# Patient Record
Sex: Male | Born: 1935 | Race: White | Hispanic: No | Marital: Married | Smoking: Never smoker
Health system: Southern US, Community
[De-identification: ages and names within clinical notes are randomized; demographics above are authoritative.]

## PROBLEM LIST (undated history)

## (undated) DIAGNOSIS — B0229 Other postherpetic nervous system involvement: Secondary | ICD-10-CM

## (undated) DIAGNOSIS — C61 Malignant neoplasm of prostate: Secondary | ICD-10-CM

## (undated) DIAGNOSIS — F0390 Unspecified dementia without behavioral disturbance: Secondary | ICD-10-CM

## (undated) DIAGNOSIS — M1990 Unspecified osteoarthritis, unspecified site: Secondary | ICD-10-CM

## (undated) DIAGNOSIS — G629 Polyneuropathy, unspecified: Secondary | ICD-10-CM

## (undated) DIAGNOSIS — I639 Cerebral infarction, unspecified: Secondary | ICD-10-CM

## (undated) HISTORY — PX: TOTAL KNEE ARTHROPLASTY: SHX125

## (undated) HISTORY — DX: Unspecified osteoarthritis, unspecified site: M19.90

## (undated) HISTORY — DX: Cerebral infarction, unspecified: I63.9

## (undated) HISTORY — DX: Malignant neoplasm of prostate: C61

---

## 2000-04-01 ENCOUNTER — Encounter: Payer: Self-pay | Admitting: Ophthalmology

## 2000-04-06 ENCOUNTER — Ambulatory Visit (HOSPITAL_COMMUNITY): Admission: RE | Admit: 2000-04-06 | Discharge: 2000-04-06 | Payer: Self-pay | Admitting: Ophthalmology

## 2000-07-06 ENCOUNTER — Ambulatory Visit (HOSPITAL_COMMUNITY): Admission: RE | Admit: 2000-07-06 | Discharge: 2000-07-06 | Payer: Self-pay | Admitting: Ophthalmology

## 2002-02-20 ENCOUNTER — Ambulatory Visit (HOSPITAL_COMMUNITY): Admission: RE | Admit: 2002-02-20 | Discharge: 2002-02-20 | Payer: Self-pay | Admitting: *Deleted

## 2002-02-20 ENCOUNTER — Encounter (INDEPENDENT_AMBULATORY_CARE_PROVIDER_SITE_OTHER): Payer: Self-pay

## 2003-10-10 ENCOUNTER — Ambulatory Visit (HOSPITAL_BASED_OUTPATIENT_CLINIC_OR_DEPARTMENT_OTHER): Admission: RE | Admit: 2003-10-10 | Discharge: 2003-10-10 | Payer: Self-pay | Admitting: Urology

## 2003-10-10 ENCOUNTER — Ambulatory Visit (HOSPITAL_COMMUNITY): Admission: RE | Admit: 2003-10-10 | Discharge: 2003-10-10 | Payer: Self-pay | Admitting: Urology

## 2004-05-02 ENCOUNTER — Ambulatory Visit: Payer: Self-pay | Admitting: Internal Medicine

## 2004-05-26 ENCOUNTER — Ambulatory Visit: Payer: Self-pay | Admitting: Internal Medicine

## 2004-06-23 ENCOUNTER — Ambulatory Visit: Payer: Self-pay | Admitting: Internal Medicine

## 2004-07-22 ENCOUNTER — Ambulatory Visit: Payer: Self-pay | Admitting: Internal Medicine

## 2004-09-15 ENCOUNTER — Ambulatory Visit: Payer: Self-pay | Admitting: Internal Medicine

## 2004-10-14 ENCOUNTER — Ambulatory Visit: Payer: Self-pay | Admitting: Internal Medicine

## 2004-11-11 ENCOUNTER — Ambulatory Visit: Payer: Self-pay | Admitting: Internal Medicine

## 2004-11-12 ENCOUNTER — Ambulatory Visit: Payer: Self-pay | Admitting: Internal Medicine

## 2004-12-10 ENCOUNTER — Ambulatory Visit: Payer: Self-pay | Admitting: Internal Medicine

## 2004-12-22 ENCOUNTER — Ambulatory Visit: Payer: Self-pay | Admitting: Internal Medicine

## 2005-01-06 ENCOUNTER — Ambulatory Visit: Payer: Self-pay | Admitting: Internal Medicine

## 2005-02-03 ENCOUNTER — Ambulatory Visit: Payer: Self-pay | Admitting: Internal Medicine

## 2005-03-03 ENCOUNTER — Ambulatory Visit: Payer: Self-pay | Admitting: Internal Medicine

## 2005-04-01 ENCOUNTER — Ambulatory Visit: Payer: Self-pay | Admitting: Internal Medicine

## 2005-04-28 ENCOUNTER — Ambulatory Visit: Payer: Self-pay | Admitting: Internal Medicine

## 2005-05-26 ENCOUNTER — Ambulatory Visit: Payer: Self-pay | Admitting: Internal Medicine

## 2005-07-21 ENCOUNTER — Ambulatory Visit: Payer: Self-pay | Admitting: Internal Medicine

## 2005-08-18 ENCOUNTER — Ambulatory Visit: Payer: Self-pay | Admitting: Internal Medicine

## 2005-09-15 ENCOUNTER — Ambulatory Visit: Payer: Self-pay | Admitting: Internal Medicine

## 2005-10-14 ENCOUNTER — Ambulatory Visit: Payer: Self-pay | Admitting: Internal Medicine

## 2005-11-03 ENCOUNTER — Inpatient Hospital Stay (HOSPITAL_COMMUNITY): Admission: RE | Admit: 2005-11-03 | Discharge: 2005-11-07 | Payer: Self-pay | Admitting: Specialist

## 2005-11-09 ENCOUNTER — Ambulatory Visit: Payer: Self-pay | Admitting: Internal Medicine

## 2005-12-08 ENCOUNTER — Ambulatory Visit: Payer: Self-pay | Admitting: Internal Medicine

## 2006-01-05 ENCOUNTER — Ambulatory Visit: Payer: Self-pay | Admitting: Internal Medicine

## 2006-02-02 ENCOUNTER — Ambulatory Visit: Payer: Self-pay | Admitting: Internal Medicine

## 2006-03-02 ENCOUNTER — Ambulatory Visit: Payer: Self-pay | Admitting: Internal Medicine

## 2006-03-31 ENCOUNTER — Ambulatory Visit: Payer: Self-pay | Admitting: Internal Medicine

## 2006-04-28 ENCOUNTER — Ambulatory Visit: Payer: Self-pay | Admitting: Internal Medicine

## 2006-05-07 ENCOUNTER — Ambulatory Visit: Payer: Self-pay | Admitting: Internal Medicine

## 2006-05-25 ENCOUNTER — Ambulatory Visit: Payer: Self-pay | Admitting: Internal Medicine

## 2006-06-22 ENCOUNTER — Ambulatory Visit: Payer: Self-pay | Admitting: Internal Medicine

## 2006-07-21 ENCOUNTER — Ambulatory Visit: Payer: Self-pay | Admitting: Internal Medicine

## 2006-08-17 ENCOUNTER — Ambulatory Visit: Payer: Self-pay | Admitting: Internal Medicine

## 2006-09-14 ENCOUNTER — Ambulatory Visit: Payer: Self-pay | Admitting: Internal Medicine

## 2006-10-12 ENCOUNTER — Ambulatory Visit: Payer: Self-pay | Admitting: Internal Medicine

## 2006-11-09 ENCOUNTER — Ambulatory Visit: Payer: Self-pay | Admitting: Internal Medicine

## 2006-12-07 ENCOUNTER — Ambulatory Visit: Payer: Self-pay | Admitting: Internal Medicine

## 2006-12-24 ENCOUNTER — Ambulatory Visit: Payer: Self-pay | Admitting: Internal Medicine

## 2007-01-04 ENCOUNTER — Ambulatory Visit: Payer: Self-pay | Admitting: Internal Medicine

## 2007-02-01 ENCOUNTER — Ambulatory Visit: Payer: Self-pay | Admitting: Internal Medicine

## 2007-03-01 ENCOUNTER — Ambulatory Visit: Payer: Self-pay | Admitting: Internal Medicine

## 2007-03-29 ENCOUNTER — Ambulatory Visit: Payer: Self-pay | Admitting: Internal Medicine

## 2007-04-26 ENCOUNTER — Ambulatory Visit: Payer: Self-pay | Admitting: Internal Medicine

## 2007-05-24 ENCOUNTER — Ambulatory Visit: Payer: Self-pay | Admitting: Internal Medicine

## 2007-05-26 DIAGNOSIS — J40 Bronchitis, not specified as acute or chronic: Secondary | ICD-10-CM | POA: Insufficient documentation

## 2007-05-26 DIAGNOSIS — C61 Malignant neoplasm of prostate: Secondary | ICD-10-CM

## 2007-05-26 DIAGNOSIS — J302 Other seasonal allergic rhinitis: Secondary | ICD-10-CM

## 2007-05-26 DIAGNOSIS — J3089 Other allergic rhinitis: Secondary | ICD-10-CM

## 2007-06-20 ENCOUNTER — Ambulatory Visit: Payer: Self-pay | Admitting: Internal Medicine

## 2007-07-20 ENCOUNTER — Telehealth: Payer: Self-pay | Admitting: Internal Medicine

## 2007-07-20 ENCOUNTER — Ambulatory Visit: Payer: Self-pay | Admitting: Internal Medicine

## 2007-08-16 ENCOUNTER — Ambulatory Visit: Payer: Self-pay | Admitting: Internal Medicine

## 2007-09-13 ENCOUNTER — Ambulatory Visit: Payer: Self-pay | Admitting: Internal Medicine

## 2007-09-14 ENCOUNTER — Ambulatory Visit: Payer: Self-pay | Admitting: Internal Medicine

## 2007-10-11 ENCOUNTER — Ambulatory Visit: Payer: Self-pay | Admitting: Internal Medicine

## 2007-11-08 ENCOUNTER — Ambulatory Visit: Payer: Self-pay | Admitting: Internal Medicine

## 2007-12-06 ENCOUNTER — Ambulatory Visit: Payer: Self-pay | Admitting: Internal Medicine

## 2008-01-03 ENCOUNTER — Ambulatory Visit: Payer: Self-pay | Admitting: Internal Medicine

## 2008-01-31 ENCOUNTER — Ambulatory Visit: Payer: Self-pay | Admitting: Internal Medicine

## 2008-02-27 ENCOUNTER — Ambulatory Visit: Payer: Self-pay | Admitting: Internal Medicine

## 2008-03-26 ENCOUNTER — Ambulatory Visit: Payer: Self-pay | Admitting: Internal Medicine

## 2008-04-04 ENCOUNTER — Ambulatory Visit: Payer: Self-pay | Admitting: Internal Medicine

## 2008-04-23 ENCOUNTER — Ambulatory Visit: Payer: Self-pay | Admitting: Internal Medicine

## 2008-05-08 ENCOUNTER — Ambulatory Visit: Payer: Self-pay | Admitting: Internal Medicine

## 2008-05-22 ENCOUNTER — Ambulatory Visit: Payer: Self-pay | Admitting: Internal Medicine

## 2008-06-25 ENCOUNTER — Ambulatory Visit: Payer: Self-pay | Admitting: Internal Medicine

## 2008-07-24 ENCOUNTER — Ambulatory Visit: Payer: Self-pay | Admitting: Internal Medicine

## 2008-07-30 ENCOUNTER — Telehealth (INDEPENDENT_AMBULATORY_CARE_PROVIDER_SITE_OTHER): Payer: Self-pay | Admitting: *Deleted

## 2008-08-20 ENCOUNTER — Ambulatory Visit: Payer: Self-pay | Admitting: Internal Medicine

## 2008-09-17 ENCOUNTER — Ambulatory Visit: Payer: Self-pay | Admitting: Internal Medicine

## 2008-10-15 ENCOUNTER — Ambulatory Visit: Payer: Self-pay | Admitting: Internal Medicine

## 2008-11-12 ENCOUNTER — Ambulatory Visit: Payer: Self-pay | Admitting: Internal Medicine

## 2008-12-10 ENCOUNTER — Ambulatory Visit: Payer: Self-pay | Admitting: Internal Medicine

## 2009-01-08 ENCOUNTER — Ambulatory Visit: Payer: Self-pay | Admitting: Internal Medicine

## 2009-01-09 ENCOUNTER — Ambulatory Visit: Payer: Self-pay | Admitting: Internal Medicine

## 2009-02-04 ENCOUNTER — Ambulatory Visit: Payer: Self-pay | Admitting: Internal Medicine

## 2009-03-04 ENCOUNTER — Ambulatory Visit: Payer: Self-pay | Admitting: Internal Medicine

## 2009-03-04 ENCOUNTER — Telehealth: Payer: Self-pay | Admitting: Internal Medicine

## 2009-04-01 ENCOUNTER — Ambulatory Visit: Payer: Self-pay | Admitting: Internal Medicine

## 2009-04-27 DIAGNOSIS — I639 Cerebral infarction, unspecified: Secondary | ICD-10-CM

## 2009-04-27 HISTORY — DX: Cerebral infarction, unspecified: I63.9

## 2009-04-29 ENCOUNTER — Ambulatory Visit: Payer: Self-pay | Admitting: Internal Medicine

## 2009-05-27 ENCOUNTER — Ambulatory Visit: Payer: Self-pay | Admitting: Internal Medicine

## 2009-06-24 ENCOUNTER — Ambulatory Visit: Payer: Self-pay | Admitting: Internal Medicine

## 2009-07-01 ENCOUNTER — Ambulatory Visit: Payer: Self-pay | Admitting: Internal Medicine

## 2009-08-05 ENCOUNTER — Ambulatory Visit: Payer: Self-pay | Admitting: Internal Medicine

## 2009-09-02 ENCOUNTER — Ambulatory Visit: Payer: Self-pay | Admitting: Internal Medicine

## 2009-09-30 ENCOUNTER — Ambulatory Visit: Payer: Self-pay | Admitting: Internal Medicine

## 2009-10-29 ENCOUNTER — Ambulatory Visit: Payer: Self-pay | Admitting: Internal Medicine

## 2009-11-25 ENCOUNTER — Ambulatory Visit: Payer: Self-pay | Admitting: Internal Medicine

## 2009-12-23 ENCOUNTER — Ambulatory Visit: Payer: Self-pay | Admitting: Internal Medicine

## 2010-01-20 ENCOUNTER — Ambulatory Visit: Payer: Self-pay | Admitting: Internal Medicine

## 2010-01-28 ENCOUNTER — Encounter (INDEPENDENT_AMBULATORY_CARE_PROVIDER_SITE_OTHER): Payer: Self-pay | Admitting: Neurology

## 2010-01-28 ENCOUNTER — Encounter: Payer: Self-pay | Admitting: Emergency Medicine

## 2010-01-28 ENCOUNTER — Inpatient Hospital Stay (HOSPITAL_COMMUNITY): Admission: AD | Admit: 2010-01-28 | Discharge: 2010-01-31 | Payer: Self-pay | Admitting: Neurology

## 2010-01-29 ENCOUNTER — Ambulatory Visit: Payer: Self-pay | Admitting: Physical Medicine & Rehabilitation

## 2010-01-31 ENCOUNTER — Inpatient Hospital Stay (HOSPITAL_COMMUNITY)
Admission: RE | Admit: 2010-01-31 | Discharge: 2010-02-22 | Payer: Self-pay | Admitting: Physical Medicine & Rehabilitation

## 2010-02-01 ENCOUNTER — Ambulatory Visit: Payer: Self-pay | Admitting: Physical Medicine & Rehabilitation

## 2010-02-04 ENCOUNTER — Ambulatory Visit: Payer: Self-pay | Admitting: Psychology

## 2010-02-24 ENCOUNTER — Telehealth (INDEPENDENT_AMBULATORY_CARE_PROVIDER_SITE_OTHER): Payer: Self-pay | Admitting: *Deleted

## 2010-02-26 ENCOUNTER — Ambulatory Visit: Payer: Self-pay | Admitting: Internal Medicine

## 2010-02-27 ENCOUNTER — Observation Stay (HOSPITAL_COMMUNITY)
Admission: EM | Admit: 2010-02-27 | Discharge: 2010-03-04 | Payer: Self-pay | Source: Home / Self Care | Admitting: Emergency Medicine

## 2010-02-28 ENCOUNTER — Ambulatory Visit: Payer: Self-pay | Admitting: Physical Medicine & Rehabilitation

## 2010-03-19 ENCOUNTER — Ambulatory Visit: Payer: Self-pay | Admitting: Internal Medicine

## 2010-04-11 ENCOUNTER — Encounter
Admission: RE | Admit: 2010-04-11 | Discharge: 2010-04-17 | Payer: Self-pay | Source: Home / Self Care | Attending: Physical Medicine & Rehabilitation | Admitting: Physical Medicine & Rehabilitation

## 2010-04-17 ENCOUNTER — Ambulatory Visit: Payer: Self-pay | Admitting: Physical Medicine & Rehabilitation

## 2010-04-25 ENCOUNTER — Ambulatory Visit: Payer: Self-pay | Admitting: Internal Medicine

## 2010-05-05 ENCOUNTER — Encounter
Admission: RE | Admit: 2010-05-05 | Discharge: 2010-05-27 | Payer: Self-pay | Source: Home / Self Care | Attending: Physical Medicine & Rehabilitation | Admitting: Physical Medicine & Rehabilitation

## 2010-05-14 ENCOUNTER — Encounter
Admission: RE | Admit: 2010-05-14 | Discharge: 2010-05-20 | Payer: Self-pay | Source: Home / Self Care | Attending: Physical Medicine & Rehabilitation | Admitting: Physical Medicine & Rehabilitation

## 2010-05-14 ENCOUNTER — Ambulatory Visit: Payer: Self-pay | Admitting: Internal Medicine

## 2010-05-16 ENCOUNTER — Ambulatory Visit: Payer: Self-pay | Admitting: Internal Medicine

## 2010-05-20 ENCOUNTER — Ambulatory Visit
Admission: RE | Admit: 2010-05-20 | Discharge: 2010-05-20 | Payer: Self-pay | Source: Home / Self Care | Attending: Physical Medicine & Rehabilitation | Admitting: Physical Medicine & Rehabilitation

## 2010-05-28 ENCOUNTER — Ambulatory Visit: Payer: Medicare Other | Attending: Physical Medicine & Rehabilitation | Admitting: Occupational Therapy

## 2010-05-28 DIAGNOSIS — M6281 Muscle weakness (generalized): Secondary | ICD-10-CM | POA: Insufficient documentation

## 2010-05-28 DIAGNOSIS — M25619 Stiffness of unspecified shoulder, not elsewhere classified: Secondary | ICD-10-CM | POA: Insufficient documentation

## 2010-05-28 DIAGNOSIS — I69998 Other sequelae following unspecified cerebrovascular disease: Secondary | ICD-10-CM | POA: Insufficient documentation

## 2010-05-28 DIAGNOSIS — R269 Unspecified abnormalities of gait and mobility: Secondary | ICD-10-CM | POA: Insufficient documentation

## 2010-05-28 DIAGNOSIS — R279 Unspecified lack of coordination: Secondary | ICD-10-CM | POA: Insufficient documentation

## 2010-05-28 DIAGNOSIS — Z5189 Encounter for other specified aftercare: Secondary | ICD-10-CM | POA: Insufficient documentation

## 2010-05-29 ENCOUNTER — Encounter: Payer: Medicare Other | Admitting: Occupational Therapy

## 2010-05-29 ENCOUNTER — Ambulatory Visit: Payer: Medicare Other | Admitting: Physical Therapy

## 2010-05-29 NOTE — Progress Notes (Signed)
Summary: allergy med Dr. Maple Hudson  Phone Note Call from Patient Call back at Home Phone [phone]   Caller: vm Summary of Call: Left vm [facility] for Dr. Terisa Starr.  Wants call back to help him get allergy med to give to self or at home Called & gave Dr. Roxy Cedar number.  Rudy Jew, RN  February 24, 2010 12:20 PM    Initial call taken by: Rudy Jew, RN,  February 24, 2010 11:11 AM

## 2010-05-29 NOTE — Miscellaneous (Signed)
Summary: Injection Record / [facility]    Injection Record / [facility]    Imported By: Lennie Odor 12/27/2009 10:22:13  _____________________________________________________________________  External Attachment:    Type:   Image     Comment:   External Document

## 2010-05-29 NOTE — Progress Notes (Signed)
Summary: Blake Harrison   Phone Note Other Incoming   Caller: Blake Harrison Summary of Call: Blake Harrison called and said he had a stroke about 4 weeks ago. He said he is at home now and has a Engineer, civil (consulting) and a therapist coming to his home. He says he is allergic to dogs and has to big greyhounds that sleep with [place] at night. He woke up Saturday and could not breathe and called the emergency room and was told to take some benadryl and use nasal spray and that helped and he was told to take 3 more benadryl for Sunday and use some nasal spray. He said he did that and is fine and wants to come pick up his allergy medicine. and get his shots from his nurse that comes to his house. I advised him how sorry I was that he was sick and that I would ask you about it and get back to him. Please advise. His last shot was 01-20-10 Initial call taken by: Clarise Cruz Duncan Dull),  February 24, 2010 4:23 PM  Follow-up for Phone Call        OK, but you  need to speak directly to his nurse to make sure she knows what to do and how to contact the allergy lab for any questions at all.   I have put Epipen on med list. Need to send him the script, and the nurse needs to know how and when to use it.  Follow-up by: Waymon Budge MD,  February 25, 2010 8:40 AM  Additional Follow-up for Phone Call Additional follow up Details #1::        Blake Harrison called back and said he contacted nurse and was advised that she can not give shots to patient. That she is not allowed to give shots. [name] ask if he came in for shots could we come down stairs and give him a shot with him sitting in the car. I advised him yes that we could do that.    New/Updated Medications: * ALLERGY VACCINE 1:10 G BY [facility] NURSE  EPIPEN 0.3 MG/0.3ML DEVI (EPINEPHRINE) For severe allergic reaction Prescriptions: EPIPEN 0.3 MG/0.3ML DEVI (EPINEPHRINE) For severe allergic reaction  #1 x prn   Entered and Authorized by:   Waymon Budge MD   Signed by:    Waymon Budge MD on 02/25/2010   Method used:   Historical   RxID:   [id number]

## 2010-05-29 NOTE — Assessment & Plan Note (Signed)
Summary: rov//mbw   Primary Provider/Referring Provider:  Thayer Headings  CC:  Follow up visit-allergies; no complaints..  History of Present Illness: 1. Allergic rhinitis. 2. Tracheobronchitis. 3. Prostate cancer - seed implants [name] [name], M.D.).   HISTORY:  He is going to the gym 6 days per week.  Feels allergy vaccine works well for him.  No problems.  He had left total knee replacement in 2007 and had chest x-ray then so he did not feel he needed to come here. He continues allergy vaccine at 1:10.   05/08/08- Allergic rhinitis, tracheobronchitis Got both flu vax. His wife got sick after H1N1, few days later he had sore throat- zinc lozenges. Now feels well. Denies routine cough, head congestion, discharge or pain. He is here for annual f/u of allergy vaccine. He feels this definitely helps. We reviewed risk/benefit and option to stop. He is getting injections once monthly without any problems.  2009/07/15- Allergic rhinitis, tracheobronchitis He continues to do well. He continues allergy vaccine at 1:10. 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 Had flu vax and has not had flu. Denies hives or cough attributable to his ACE inhibitor.        Current Medications (verified): 1)  Allergy Vaccine 1:10 Gh 2)  Aspirin 81 Mg Tbec (Aspirin) .... Take 1 By Mouth Once Daily 3)  Lisinopril-Hydrochlorothiazide 20-12.5 Mg Tabs (Lisinopril-Hydrochlorothiazide) .... Take 2 By Mouth Once Daily 4)  Multivitamins   Tabs (Multiple Vitamin) 5)  Naproxen .... Take As Needed 6)  Diazepam 5 Mg Tabs (Diazepam) .... Take 1/2 To 1 Two Times A Day As Needed 7)  Vitamin D3 2000 Unit Caps (Cholecalciferol) .... Take 1 By Mouth Once Daily 8)  Norvasc 2.5 Mg Tabs (Amlodipine Besylate) .... 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 Rides bike at gym  Review of Systems      See HPI  The patient denies anorexia, fever, weight loss, weight gain, vision loss, decreased hearing, hoarseness, chest pain, syncope, dyspnea on exertion, peripheral edema, prolonged cough, headaches, hemoptysis, abdominal pain, and severe indigestion/heartburn.    Vital Signs:  Patient profile:   77 year old male Weight:      141.13 pounds O2 Sat:      98 % on Room air Pulse rate:   66 / minute BP sitting:   134 / 78  (left arm) Cuff size:   regular  Vitals Entered By: Reynaldo Minium CMA 2009-07-15 9:26 AM)  O2 Flow:  Room air  Physical Exam  Additional Exam:  General: A/Ox3; pleasant and cooperative, NAD, trim  and alert SKIN: no rash, lesions NODES: no lymphadenopathy HEENT: [place]/AT, EOM- WNL, Conjuctivae- clear, PERRLA, TM-WNL, Nose- clear, Throat- clear and wnl, Mallampati  II NECK: Supple w/ fair ROM, JVD- none, normal carotid impulses w/o bruits Thyroid- normal to palpation CHEST: Clear to P&A HEART: RRR, no m/g/r heard ABDOMEN: Soft and nl;  ZOX:WRUE, nl pulses, no edema  NEURO: Grossly intact to observation      Impression & Recommendations:  Problem # 1:  ALLERGIC RHINITIS (ICD-477.9)  Doing very well. He  has been on allergy vaccine a long time. We discussed continuation  He gets his shots monthly.    Medications Added to Medication List This Visit: 1)  Aspirin 81 Mg Tbec (Aspirin) .... Take 1 by mouth once daily 2)  Lisinopril-hydrochlorothiazide 20-12.5 Mg Tabs (Lisinopril-hydrochlorothiazide) .... Take 2 by mouth once daily 3)  Diazepam 5 Mg Tabs (Diazepam) .... Take 1/2 to 1 two times a day as needed 4)  Vitamin D3 2000 Unit Caps (Cholecalciferol) .... Take 1 by mouth once daily 5)  Norvasc 2.5 Mg Tabs (Amlodipine besylate) .... Take 1 by mouth once daily  Other Orders: Est. Patient Level III (45409)  Patient Instructions: 1)  Schedule return in one year, earlier if needed 2)  OK to continue allergy vaccine

## 2010-06-02 ENCOUNTER — Ambulatory Visit: Payer: Medicare Other | Admitting: Occupational Therapy

## 2010-06-02 ENCOUNTER — Ambulatory Visit: Payer: Medicare Other | Admitting: Physical Therapy

## 2010-06-03 ENCOUNTER — Ambulatory Visit: Payer: Medicare Other | Admitting: Physical Therapy

## 2010-06-03 ENCOUNTER — Ambulatory Visit: Payer: Medicare Other | Admitting: Occupational Therapy

## 2010-06-05 ENCOUNTER — Ambulatory Visit: Payer: Medicare Other | Admitting: Occupational Therapy

## 2010-06-05 ENCOUNTER — Ambulatory Visit: Payer: Medicare Other | Admitting: Physical Therapy

## 2010-06-09 ENCOUNTER — Ambulatory Visit: Payer: Medicare Other | Admitting: Physical Therapy

## 2010-06-09 ENCOUNTER — Ambulatory Visit: Payer: Medicare Other | Admitting: Occupational Therapy

## 2010-06-10 ENCOUNTER — Ambulatory Visit: Payer: Self-pay | Admitting: Physical Therapy

## 2010-06-10 ENCOUNTER — Encounter: Payer: Self-pay | Admitting: Occupational Therapy

## 2010-06-12 ENCOUNTER — Ambulatory Visit: Payer: Medicare Other | Admitting: Occupational Therapy

## 2010-06-12 ENCOUNTER — Ambulatory Visit (INDEPENDENT_AMBULATORY_CARE_PROVIDER_SITE_OTHER): Payer: Medicare Other

## 2010-06-12 ENCOUNTER — Ambulatory Visit: Payer: Medicare Other | Admitting: Physical Therapy

## 2010-06-12 DIAGNOSIS — J301 Allergic rhinitis due to pollen: Secondary | ICD-10-CM

## 2010-06-16 ENCOUNTER — Encounter: Payer: Medicare Other | Attending: Physical Medicine & Rehabilitation

## 2010-06-16 ENCOUNTER — Ambulatory Visit: Payer: Medicare Other | Admitting: Physical Medicine & Rehabilitation

## 2010-06-16 ENCOUNTER — Ambulatory Visit: Payer: Medicare Other | Admitting: Occupational Therapy

## 2010-06-16 ENCOUNTER — Ambulatory Visit: Payer: Medicare Other | Admitting: Physical Therapy

## 2010-06-16 DIAGNOSIS — M6250 Muscle wasting and atrophy, not elsewhere classified, unspecified site: Secondary | ICD-10-CM | POA: Insufficient documentation

## 2010-06-16 DIAGNOSIS — G609 Hereditary and idiopathic neuropathy, unspecified: Secondary | ICD-10-CM

## 2010-06-16 DIAGNOSIS — I69959 Hemiplegia and hemiparesis following unspecified cerebrovascular disease affecting unspecified side: Secondary | ICD-10-CM | POA: Insufficient documentation

## 2010-06-16 DIAGNOSIS — M216X9 Other acquired deformities of unspecified foot: Secondary | ICD-10-CM | POA: Insufficient documentation

## 2010-06-19 ENCOUNTER — Ambulatory Visit: Payer: Medicare Other | Admitting: Physical Therapy

## 2010-06-19 ENCOUNTER — Ambulatory Visit: Payer: Medicare Other | Admitting: Occupational Therapy

## 2010-06-23 ENCOUNTER — Ambulatory Visit: Payer: Medicare Other | Admitting: Occupational Therapy

## 2010-06-23 ENCOUNTER — Ambulatory Visit: Payer: Medicare Other | Admitting: Physical Therapy

## 2010-06-26 ENCOUNTER — Ambulatory Visit: Payer: Medicare Other | Admitting: Physical Therapy

## 2010-06-26 ENCOUNTER — Ambulatory Visit: Payer: Medicare Other | Attending: Physical Medicine & Rehabilitation | Admitting: Occupational Therapy

## 2010-06-26 DIAGNOSIS — R269 Unspecified abnormalities of gait and mobility: Secondary | ICD-10-CM | POA: Insufficient documentation

## 2010-06-26 DIAGNOSIS — Z5189 Encounter for other specified aftercare: Secondary | ICD-10-CM | POA: Insufficient documentation

## 2010-06-26 DIAGNOSIS — R279 Unspecified lack of coordination: Secondary | ICD-10-CM | POA: Insufficient documentation

## 2010-06-26 DIAGNOSIS — M25619 Stiffness of unspecified shoulder, not elsewhere classified: Secondary | ICD-10-CM | POA: Insufficient documentation

## 2010-06-26 DIAGNOSIS — M6281 Muscle weakness (generalized): Secondary | ICD-10-CM | POA: Insufficient documentation

## 2010-06-26 DIAGNOSIS — I69998 Other sequelae following unspecified cerebrovascular disease: Secondary | ICD-10-CM | POA: Insufficient documentation

## 2010-06-30 ENCOUNTER — Ambulatory Visit (INDEPENDENT_AMBULATORY_CARE_PROVIDER_SITE_OTHER): Payer: Medicare Other | Admitting: Internal Medicine

## 2010-06-30 ENCOUNTER — Encounter: Payer: Self-pay | Admitting: Internal Medicine

## 2010-06-30 ENCOUNTER — Ambulatory Visit: Payer: Medicare Other | Admitting: Physical Therapy

## 2010-06-30 ENCOUNTER — Ambulatory Visit: Payer: Medicare Other | Admitting: Occupational Therapy

## 2010-06-30 DIAGNOSIS — J309 Allergic rhinitis, unspecified: Secondary | ICD-10-CM

## 2010-07-02 ENCOUNTER — Telehealth (INDEPENDENT_AMBULATORY_CARE_PROVIDER_SITE_OTHER): Payer: Self-pay | Admitting: *Deleted

## 2010-07-03 ENCOUNTER — Ambulatory Visit: Payer: Medicare Other | Admitting: Occupational Therapy

## 2010-07-03 ENCOUNTER — Ambulatory Visit: Payer: Medicare Other | Admitting: Physical Therapy

## 2010-07-07 ENCOUNTER — Encounter: Payer: Medicare Other | Admitting: Occupational Therapy

## 2010-07-07 ENCOUNTER — Ambulatory Visit: Payer: Medicare Other | Admitting: Physical Therapy

## 2010-07-08 ENCOUNTER — Ambulatory Visit: Payer: Medicare Other | Admitting: Physical Therapy

## 2010-07-08 LAB — URINALYSIS, ROUTINE W REFLEX MICROSCOPIC
Bilirubin Urine: NEGATIVE
Nitrite: NEGATIVE
Specific Gravity, Urine: 1.018 (ref 1.005–1.030)
pH: 5.5 (ref 5.0–8.0)

## 2010-07-08 LAB — BASIC METABOLIC PANEL
CO2: 28 mEq/L (ref 19–32)
Chloride: 109 mEq/L (ref 96–112)
GFR calc Af Amer: 49 mL/min — ABNORMAL LOW (ref >60)
GFR calc non Af Amer: 33 mL/min — ABNORMAL LOW (ref >60)
Potassium: 3.5 mEq/L (ref 3.5–5.1)
Potassium: 4.3 mEq/L (ref 3.5–5.1)
Sodium: 139 mEq/L (ref 135–145)
Sodium: 140 mEq/L (ref 135–145)

## 2010-07-08 LAB — URINE CULTURE: Culture: NO GROWTH

## 2010-07-08 LAB — CBC
HCT: 30.7 % — ABNORMAL LOW (ref 39.0–52.0)
HCT: 32.1 % — ABNORMAL LOW (ref 39.0–52.0)
Hemoglobin: 10.4 g/dL — ABNORMAL LOW (ref 13.0–17.0)
Hemoglobin: 11.1 g/dL — ABNORMAL LOW (ref 13.0–17.0)
Hemoglobin: 11.4 g/dL — ABNORMAL LOW (ref 13.0–17.0)
MCH: 32.1 pg (ref 26.0–34.0)
MCH: 32.2 pg (ref 26.0–34.0)
MCV: 94.7 fL (ref 78.0–100.0)
RBC: 3.24 MIL/uL — ABNORMAL LOW (ref 4.22–5.81)
RBC: 3.42 MIL/uL — ABNORMAL LOW (ref 4.22–5.81)
RBC: 3.53 MIL/uL — ABNORMAL LOW (ref 4.22–5.81)
WBC: 6.4 10*3/uL (ref 4.0–10.5)

## 2010-07-08 LAB — DIFFERENTIAL
Basophils Absolute: 0 10*3/uL (ref 0.0–0.1)
Lymphocytes Relative: 5 % — ABNORMAL LOW (ref 12–46)
Lymphs Abs: 0.3 10*3/uL — ABNORMAL LOW (ref 0.7–4.0)
Monocytes Absolute: 0.4 10*3/uL (ref 0.1–1.0)
Monocytes Relative: 6 % (ref 3–12)
Neutro Abs: 5.6 10*3/uL (ref 1.7–7.7)

## 2010-07-08 LAB — URINE MICROSCOPIC-ADD ON

## 2010-07-08 NOTE — Progress Notes (Signed)
Summary: med list, epipen rx  Phone Note Call from Patient Call back at Home Phone [phone]   Caller: Patient Call For: young Reason for Call: Talk to Nurse Summary of Call: Patient calling with updated med list.  Patient is no longer taking diazepam and Vit D should be 1000 units a day instead of 2000. Initial call taken by: Lehman Prom,  July 02, 2010 3:54 PM  Follow-up for Phone Call        called, spoke with pt.  States CDY wanted him to call back to correct his med list.  States he is no longer taking diazepam, is taking vit d 1000mg  once daily, and states he thinks he has epipen "somewhere in the house" but not sure as he has never used it.  Advised he is supposed to have epipen on hand d/t taking allergy shots.  He verbalized understanding.  Will forward to CDY as pt states he was requesting this info. Follow-up by: Gweneth Dimitri RN,  July 02, 2010 4:15 PM  Additional Follow-up for Phone Call Additional follow up Details #1::        Please send Rx for Epipen for patient. Noted.Reynaldo Minium CMA  July 03, 2010 4:41 PM   ATC pt's home # - ? pharm.  NA and unable to leave message. Gweneth Dimitri RN  July 03, 2010 4:46 PM  [facility] Gweneth Dimitri RN  July 04, 2010 8:41 AM      Additional Follow-up for Phone Call Additional follow up Details #2::    Pt returned call.  He was informed he needs to pick up epipen rx at pharm as this is part of allergy vaccine protocol.  He verbalized understanding.  Also, states he has now found vitamin d3 2000 and is taking this in place of the 1000.  Advised I will change this on med list.  He verbalized understanding of instructions.  New/Updated Medications: VITAMIN D 1000 UNIT TABS (CHOLECALCIFEROL) Take 1 tablet by mouth once a day VITAMIN D3 2000 UNIT TABS (CHOLECALCIFEROL) Take 1 tablet by mouth once a day Prescriptions: EPIPEN 0.3 MG/0.3ML DEVI (EPINEPHRINE) For severe allergic reaction  #1 x prn   Entered by:   Gweneth Dimitri  RN   Authorized by:   Waymon Budge MD   Signed by:   Gweneth Dimitri RN on 07/04/2010   Method used:   Electronically to        [facility]* (retail)       [address]       [place], [place]  [id number]       Ph: [phone]       Fax: [phone]   RxID:   [id number]

## 2010-07-08 NOTE — Assessment & Plan Note (Signed)
Summary: yearly follow up   Primary Provider/Referring Provider:  Thayer Headings  CC:  Blake Harrison follow up visit-allergies; still taking vaccine and no complaints..  History of Present Illness:  05/08/08- Allergic rhinitis, tracheobronchitis Got both flu vax. His wife got sick after H1N1, few days later he had sore throat- zinc lozenges. Now feels well. Denies routine cough, head congestion, discharge or pain. He is here for annual f/u of allergy vaccine. He feels this definitely helps. We reviewed risk/benefit and option to stop. He is getting injections once monthly without any problems.  07-23-2009- Allergic rhinitis, tracheobronchitis He continues to do well. He continues allergy vaccine at 1:10. 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 Had flu vax and has not had flu. Denies hives or cough attributable to his ACE inhibitor.  June 30, 2010-  Allergic rhinitis, tracheobronchitis Nurse-CC:  Yearly follow up visit-allergies; still taking vaccine and no complaints. Since last here he had a stroke- affected left body, but back to driving. He is coming here for his shots again. He has not been noting  rhinitis symptoms so far this Spring     Preventive Screening-Counseling & Management  Alcohol-Tobacco     Smoking Status: never  Current Medications (verified): 1)  Allergy Vaccine 1:10 G By [facility] Nurse 2)  Aspirin 325 Mg Tabs (Aspirin) .... Take 1 By Mouth Once Daily 3)  Lisinopril-Hydrochlorothiazide 20-12.5 Mg Tabs (Lisinopril-Hydrochlorothiazide) .... Take 2 By Mouth Once Daily 4)  Multivitamins   Tabs (Multiple Vitamin) 5)  Naproxen .... Take As Needed 6)  Diazepam 5 Mg Tabs (Diazepam) .... Take 1/2 To 1 Two Times A Day As Needed 7)  Vitamin D3 2000 Unit Caps (Cholecalciferol) .... Take 1 By Mouth Once Daily 8)  Norvasc 2.5 Mg Tabs (Amlodipine Besylate) .... Take 1 By Mouth Once  Daily 9)  Epipen 0.3 Mg/0.18ml Devi (Epinephrine) .... 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 Rides bike at gym  Risk Factors: Smoking Status: never (06/30/2010)  Past Medical History: Allergic Rhinitis Arthritis knees Prostate cancer- seeds CVA 2011  Review of Systems      See HPI       The patient complains of nasal congestion/difficulty breathing through nose.  The patient denies shortness of breath with activity, shortness of breath at rest, productive cough, non-productive cough, coughing up blood, chest pain, irregular heartbeats, acid heartburn, indigestion, loss of appetite, weight change, abdominal pain, difficulty swallowing, sore throat, tooth/dental problems, headaches, sneezing, itching, ear ache, rash, change in color of mucus, and fever.    Vital Signs:  Patient profile:   77 year old male Weight:      136.50 pounds O2 Sat:      94 % on Room air Pulse rate:   62 / minute BP sitting:   122 / 70  (left arm) Cuff size:   regular  Vitals Entered By: Reynaldo Minium CMA (June 30, 2010 9:28 AM)  O2 Flow:  Room air CC: Yearly follow up visit-allergies; still taking vaccine and  no complaints.   Physical Exam  Additional Exam:  General: A/Ox3; pleasant and cooperative, NAD, trim and alert SKIN: no rash, lesions NODES: no lymphadenopathy HEENT: [place]/AT, EOM- WNL, Conjuctivae- clear, PERRLA, TM-WNL, Nose- clear, Throat- clear and wnl, Mallampati  II NECK: Supple w/ fair ROM, JVD- none, normal carotid impulses w/o bruits Thyroid- normal to palpation CHEST: Clear to P&A HEART: RRR, no  m/g/r heard ABDOMEN: Soft and nl;  ZOX:WRUE, nl pulses, no edema  NEURO: Weak left body, cane, alert and articulate.      Impression & Recommendations:  Problem # 1:  ALLERGIC RHINITIS (ICD-477.9)  Good control as he continues allergy vaccine. He is satsfied that he is doing well. We have discussed ok to stop, but he votes to continue.   Medications Added to Medication List This Visit: 1)  Aspirin 325 Mg Tabs (Aspirin) .... Take 1 by mouth once daily  Other Orders: Est. Patient Level III (45409)  Patient Instructions: 1)  Please schedule a follow-up appointment in 1 year. 2)  Continue allergy vaccine as discussed.

## 2010-07-09 LAB — URINALYSIS, MICROSCOPIC ONLY
Bilirubin Urine: NEGATIVE
Nitrite: NEGATIVE
Protein, ur: 30 mg/dL — AB
Specific Gravity, Urine: 1.023 (ref 1.005–1.030)
Urobilinogen, UA: 0.2 mg/dL (ref 0.0–1.0)

## 2010-07-09 LAB — CBC
HCT: 33.2 % — ABNORMAL LOW (ref 39.0–52.0)
HCT: 33.3 % — ABNORMAL LOW (ref 39.0–52.0)
HCT: 37.5 % — ABNORMAL LOW (ref 39.0–52.0)
HCT: 39.8 % (ref 39.0–52.0)
HCT: 40.3 % (ref 39.0–52.0)
HCT: 40.5 % (ref 39.0–52.0)
Hemoglobin: 11.1 g/dL — ABNORMAL LOW (ref 13.0–17.0)
Hemoglobin: 11.3 g/dL — ABNORMAL LOW (ref 13.0–17.0)
Hemoglobin: 12.8 g/dL — ABNORMAL LOW (ref 13.0–17.0)
Hemoglobin: 12.9 g/dL — ABNORMAL LOW (ref 13.0–17.0)
Hemoglobin: 13.9 g/dL (ref 13.0–17.0)
Hemoglobin: 13.9 g/dL (ref 13.0–17.0)
MCH: 31.2 pg (ref 26.0–34.0)
MCH: 31.6 pg (ref 26.0–34.0)
MCH: 31.2 pg (ref 26.0–34.0)
MCH: 31.4 pg (ref 26.0–34.0)
MCH: 31.7 pg (ref 26.0–34.0)
MCH: 31.8 pg (ref 26.0–34.0)
MCHC: 32.9 g/dL (ref 30.0–36.0)
MCHC: 33.9 g/dL (ref 30.0–36.0)
MCHC: 33.8 g/dL (ref 30.0–36.0)
MCHC: 34.1 g/dL (ref 30.0–36.0)
MCV: 91.8 fL (ref 78.0–100.0)
MCV: 92.1 fL (ref 78.0–100.0)
MCV: 93 fL (ref 78.0–100.0)
MCV: 93.3 fL (ref 78.0–100.0)
MCV: 94.1 fL (ref 78.0–100.0)
MCV: 94.4 fL (ref 78.0–100.0)
MCV: 95.4 fL (ref 78.0–100.0)
MCV: 95.8 fL (ref 78.0–100.0)
Platelets: 207 10*3/uL (ref 150–400)
Platelets: 213 10*3/uL (ref 150–400)
Platelets: 233 10*3/uL (ref 150–400)
RBC: 3.56 MIL/uL — ABNORMAL LOW (ref 4.22–5.81)
RBC: 4.14 MIL/uL — ABNORMAL LOW (ref 4.22–5.81)
RBC: 4.39 MIL/uL (ref 4.22–5.81)
RDW: 11.8 % (ref 11.5–15.5)
RDW: 11.9 % (ref 11.5–15.5)
RDW: 12.2 % (ref 11.5–15.5)
RDW: 12.3 % (ref 11.5–15.5)
WBC: 5.5 10*3/uL (ref 4.0–10.5)
WBC: 5.6 10*3/uL (ref 4.0–10.5)
WBC: 5.9 10*3/uL (ref 4.0–10.5)
WBC: 8.3 10*3/uL (ref 4.0–10.5)

## 2010-07-09 LAB — COMPREHENSIVE METABOLIC PANEL
ALT: 21 U/L (ref 0–53)
AST: 24 U/L (ref 0–37)
AST: 25 U/L (ref 0–37)
Albumin: 3.4 g/dL — ABNORMAL LOW (ref 3.5–5.2)
Albumin: 3.6 g/dL (ref 3.5–5.2)
Alkaline Phosphatase: 49 U/L (ref 39–117)
Alkaline Phosphatase: 54 U/L (ref 39–117)
BUN: 14 mg/dL (ref 6–23)
BUN: 19 mg/dL (ref 6–23)
BUN: 20 mg/dL (ref 6–23)
CO2: 30 mEq/L (ref 19–32)
CO2: 30 mEq/L (ref 19–32)
Calcium: 9.2 mg/dL (ref 8.4–10.5)
Chloride: 102 mEq/L (ref 96–112)
Chloride: 102 mEq/L (ref 96–112)
Creatinine, Ser: 1.1 mg/dL (ref 0.4–1.5)
Creatinine, Ser: 1.24 mg/dL (ref 0.4–1.5)
GFR calc Af Amer: >60 mL/min (ref >60)
GFR calc non Af Amer: 60 mL/min — ABNORMAL LOW (ref >60)
GFR calc non Af Amer: >60 mL/min (ref >60)
Glucose, Bld: 118 mg/dL — ABNORMAL HIGH (ref 70–99)
Potassium: 3.5 mEq/L (ref 3.5–5.1)
Potassium: 4 mEq/L (ref 3.5–5.1)
Sodium: 137 mEq/L (ref 135–145)
Total Bilirubin: 1.2 mg/dL (ref 0.3–1.2)
Total Bilirubin: 1.3 mg/dL — ABNORMAL HIGH (ref 0.3–1.2)
Total Bilirubin: 1.4 mg/dL — ABNORMAL HIGH (ref 0.3–1.2)

## 2010-07-09 LAB — PROTIME-INR
INR: 1.03 (ref 0.00–1.49)
INR: 1.06 (ref 0.00–1.49)
Prothrombin Time: 13.7 seconds (ref 11.6–15.2)

## 2010-07-09 LAB — DIFFERENTIAL
Basophils Absolute: 0 10*3/uL (ref 0.0–0.1)
Basophils Absolute: 0 10*3/uL (ref 0.0–0.1)
Basophils Relative: 0 % (ref 0–1)
Eosinophils Absolute: 0 10*3/uL (ref 0.0–0.7)
Eosinophils Relative: 2 % (ref 0–5)
Lymphocytes Relative: 10 % — ABNORMAL LOW (ref 12–46)
Lymphs Abs: 0.6 10*3/uL — ABNORMAL LOW (ref 0.7–4.0)
Monocytes Absolute: 0.6 10*3/uL (ref 0.1–1.0)
Neutro Abs: 4.7 10*3/uL (ref 1.7–7.7)
Neutro Abs: 4.9 10*3/uL (ref 1.7–7.7)
Neutrophils Relative %: 89 % — ABNORMAL HIGH (ref 43–77)

## 2010-07-09 LAB — URINE CULTURE
Colony Count: NO GROWTH
Culture  Setup Time: 201110241130
Culture: NO GROWTH

## 2010-07-09 LAB — URINALYSIS, ROUTINE W REFLEX MICROSCOPIC
Bilirubin Urine: NEGATIVE
Hgb urine dipstick: NEGATIVE
Ketones, ur: NEGATIVE mg/dL
Protein, ur: NEGATIVE mg/dL
Specific Gravity, Urine: 1.013 (ref 1.005–1.030)
Urobilinogen, UA: 0.2 mg/dL (ref 0.0–1.0)

## 2010-07-09 LAB — GLUCOSE, CAPILLARY

## 2010-07-09 LAB — LIPID PANEL
Cholesterol: 171 mg/dL (ref 0–200)
HDL: 56 mg/dL (ref >39)

## 2010-07-09 LAB — URINE MICROSCOPIC-ADD ON

## 2010-07-09 LAB — HEMOGLOBIN A1C: Hgb A1c MFr Bld: 5.5 % (ref <5.7)

## 2010-07-10 ENCOUNTER — Ambulatory Visit (INDEPENDENT_AMBULATORY_CARE_PROVIDER_SITE_OTHER): Payer: Medicare Other

## 2010-07-10 ENCOUNTER — Ambulatory Visit: Payer: Medicare Other | Admitting: Physical Therapy

## 2010-07-10 ENCOUNTER — Encounter: Payer: Self-pay | Admitting: Internal Medicine

## 2010-07-10 DIAGNOSIS — J301 Allergic rhinitis due to pollen: Secondary | ICD-10-CM

## 2010-07-14 ENCOUNTER — Ambulatory Visit: Payer: Medicare Other | Admitting: Physical Therapy

## 2010-07-15 NOTE — Assessment & Plan Note (Signed)
Summary: allergy/cb  Nurse Visit   Allergies: No Known Drug Allergies  Orders Added: 1)  Allergy Injection (1) [95115] 

## 2010-07-18 ENCOUNTER — Ambulatory Visit: Payer: Medicare Other | Admitting: Physical Therapy

## 2010-07-21 ENCOUNTER — Ambulatory Visit: Payer: Medicare Other | Admitting: Physical Therapy

## 2010-07-22 ENCOUNTER — Ambulatory Visit: Payer: Medicare Other | Admitting: Physical Medicine & Rehabilitation

## 2010-07-22 ENCOUNTER — Encounter: Payer: Medicare Other | Attending: Physical Medicine & Rehabilitation

## 2010-07-22 DIAGNOSIS — I6330 Cerebral infarction due to thrombosis of unspecified cerebral artery: Secondary | ICD-10-CM

## 2010-07-22 DIAGNOSIS — I69959 Hemiplegia and hemiparesis following unspecified cerebrovascular disease affecting unspecified side: Secondary | ICD-10-CM | POA: Insufficient documentation

## 2010-07-22 DIAGNOSIS — G609 Hereditary and idiopathic neuropathy, unspecified: Secondary | ICD-10-CM

## 2010-07-22 DIAGNOSIS — M216X9 Other acquired deformities of unspecified foot: Secondary | ICD-10-CM | POA: Insufficient documentation

## 2010-07-22 DIAGNOSIS — M6250 Muscle wasting and atrophy, not elsewhere classified, unspecified site: Secondary | ICD-10-CM | POA: Insufficient documentation

## 2010-07-22 DIAGNOSIS — G8110 Spastic hemiplegia affecting unspecified side: Secondary | ICD-10-CM

## 2010-07-24 ENCOUNTER — Ambulatory Visit: Payer: Medicare Other | Admitting: Physical Therapy

## 2010-07-28 ENCOUNTER — Ambulatory Visit: Payer: Medicare Other | Admitting: Physical Therapy

## 2010-07-31 ENCOUNTER — Ambulatory Visit: Payer: Medicare Other | Admitting: Physical Therapy

## 2010-08-07 ENCOUNTER — Ambulatory Visit (INDEPENDENT_AMBULATORY_CARE_PROVIDER_SITE_OTHER): Payer: Medicare Other

## 2010-08-07 DIAGNOSIS — J309 Allergic rhinitis, unspecified: Secondary | ICD-10-CM

## 2010-08-11 ENCOUNTER — Ambulatory Visit (INDEPENDENT_AMBULATORY_CARE_PROVIDER_SITE_OTHER): Payer: Medicare Other | Admitting: Internal Medicine

## 2010-08-11 ENCOUNTER — Encounter: Payer: Self-pay | Admitting: Internal Medicine

## 2010-08-11 VITALS — BP 118/66 | HR 70 | Ht 69.0 in | Wt 135.8 lb

## 2010-08-11 DIAGNOSIS — J309 Allergic rhinitis, unspecified: Secondary | ICD-10-CM

## 2010-08-11 DIAGNOSIS — J40 Bronchitis, not specified as acute or chronic: Secondary | ICD-10-CM

## 2010-08-11 MED ORDER — METHYLPREDNISOLONE ACETATE 80 MG/ML IJ SUSP
80.0000 mg | Freq: Once | INTRAMUSCULAR | Status: AC
  Administered 2010-08-11: 80 mg via INTRAMUSCULAR

## 2010-08-11 MED ORDER — HYDROCODONE-HOMATROPINE 5-1.5 MG/5ML PO SYRP: 5.0000 mL | ORAL_SOLUTION | Freq: Four times a day (QID) | ORAL | Status: AC | PRN

## 2010-08-11 MED ORDER — PHENYLEPHRINE HCL 1 % NA SOLN
3.0000 [drp] | Freq: Once | NASAL | Status: AC
  Administered 2010-08-11: 3 [drp] via NASAL

## 2010-08-11 MED ORDER — AZITHROMYCIN 250 MG PO TABS: ORAL_TABLET | ORAL | Status: AC

## 2010-08-11 NOTE — Patient Instructions (Signed)
Fluids and comfort treatments as needed  Scripts printed for cough syrup, and for a Z pak antibiotic to hold in ase you see fever, discolored sputum, etc of actual infection.  Neb neo nasal  Depo 80

## 2010-08-11 NOTE — Progress Notes (Signed)
  Subjective:    Patient ID: Blake Harrison, male    DOB: 01-05-1926, 77 y.o.   MRN: [id number]  HPI 77 yo never smoker, followed for allergic rhinitis, hx of tracheobronchitis, prostate cancer and CVA. Last here- June 30, 2010. He gets allergy vaccine here every 4 weeks. The day after his last shot, about 1 week ago, he got irritated throat, nasal congestion, much productive wheezey cough, tussive soreness in sternum. Denies, fever, and he doubts infection.    Review of Systems  Constitutional: Positive for fatigue. Negative for fever, activity change, appetite change and unexpected weight change.  HENT: Negative for ear pain, nosebleeds, congestion, facial swelling, neck pain, neck stiffness, tinnitus and ear discharge.   Eyes: Negative for pain, discharge and itching.  Respiratory: Positive for cough, chest tightness and wheezing.   Genitourinary: Negative for dysuria and enuresis.  Neurological: Negative for dizziness.  Hematological: Negative for adenopathy. Does not bruise/bleed easily.  All other systems reviewed and are negative.       Objective:   Physical Exam General- Alert, Oriented, Affect-appropriate, Distress- uncomfortable looking. Carries a cane.  Skin- rash-none, lesions- none, excoriation- none  Lymphadenopathy- none  Head- atraumatic  Eyes- Gross vision intact, PERRLA, conjunctivae clear secretions  Ears-  Normal-Hearing, canals, Tm L ,   R ,  Nose- Turbinate edema,  Clear mucus, polyps, erosion, perforation   Throat- Mallampati II , mucosa clear , drainage- none, tonsils- atrophic  Neck- flexible , trachea midline, no stridor , thyroid nl, carotid no bruit  Chest - symmetrical excursion , unlabored     Heart/CV- RRR , no murmur , no gallop  , no rub, nl s1 s2                     - JVD- none , edema- none, stasis changes- none, varices- none     Lung- clear to P&A, wheeze- none   cough-with deep breath, but no wheeze;    dullness-none, rub- none   Chest wall- not really tender at sternum  Abd- tender-no, distended-no, bowel sounds-present, HSM- no  Br/ Gen/ Rectal- Not done, not indicated  Extrem- cyanosis- none, clubbing, none, atrophy- none, strength- nl  Neuro- grossly intact to observation         Assessment & Plan:

## 2010-08-11 NOTE — Assessment & Plan Note (Addendum)
Acute rhinitis with bronchitis. Nonspecific but I suspect this may be a seasonal pollen problem- ddx is viral. Cough has been distressing, but secretions clear and I hear no wheeze no nodes or erythema. We will give nasal neb and depo, cough syrup, and a script to hold for Z pak. Marland Kitchen

## 2010-08-13 ENCOUNTER — Telehealth: Payer: Self-pay | Admitting: Internal Medicine

## 2010-08-13 NOTE — Telephone Encounter (Signed)
Left message with family member for pt to call office back.

## 2010-08-13 NOTE — Telephone Encounter (Signed)
Per CDY: ok to use mucinex as already doing.  Ok to add afrin otc as directed on box for next 2-3 days as needed.  Ok to add delsym for cough.  Thanks.

## 2010-08-13 NOTE — Telephone Encounter (Signed)
Pt states he had to stop Hydromet due to constipation but is taking liquid mucinex max and this has loosened up the mucus. Mucus is light yellow in color and only slightly better. Pls advise of any recs.No Known Allergies

## 2010-08-14 NOTE — Telephone Encounter (Signed)
Spoke w/ pt and advised him of cdy recs. Pt verbalized understanding and needed nothing else further

## 2010-08-15 ENCOUNTER — Telehealth: Payer: Self-pay | Admitting: Internal Medicine

## 2010-08-15 NOTE — Telephone Encounter (Signed)
Per CY--ok for hydromet  1 tsp every 6 hours prn for cough and ok to use afrin sparingly.  Called and spoke with pt and he is aware of CY recs and that hydromet he already has this at home but does not like to take it due to it makes him constipated. Explained to pt to take the hydromet if needed and to cont the otc cough meds as needed.

## 2010-08-15 NOTE — Telephone Encounter (Signed)
Called and spoke with pt and he stated that CY told him to only use the afrin x 2 days and he has already used those 2 days up.  Pts question is if over the weekend can he use the afrin if needed for his nasal passages? And he is also taking 2 otc cough meds that are not helping.  Cough at times with light yellow sputum. Please advise. Thanks.  Pt uses [facility] and [facility]

## 2010-08-15 NOTE — Telephone Encounter (Signed)
Per last phone note pt was to use afrin as directed per the box x 2-3 days. Pt advised of this. Carron Curie, CMA

## 2010-08-16 ENCOUNTER — Encounter: Payer: Self-pay | Admitting: Internal Medicine

## 2010-08-16 NOTE — Assessment & Plan Note (Signed)
Mild acute bronchitis- viral vs allergic. I will hold script for Z pak in case of worsening over weekend as discussed.

## 2010-08-18 ENCOUNTER — Telehealth: Payer: Self-pay | Admitting: Internal Medicine

## 2010-08-18 NOTE — Telephone Encounter (Signed)
Called, LM w/ pt's wife TCB.  Per pt's wife, pt will not be home until after the office closes, advised her pt can call tomorrow when we open at 0800.  Pt last seen by CDY 4.16.12, was given hydromet and zpak, along w/ depo 80 and nasal neb.  4.18.12 phone msg, was was rec'd to take mucinex, afrin and delsym.

## 2010-08-19 ENCOUNTER — Telehealth: Payer: Self-pay | Admitting: Internal Medicine

## 2010-08-19 NOTE — Telephone Encounter (Signed)
Spoke w/ pt and he states he can take cough syrup w/ codeine and has as omea t home and will take that to see if that helps. Pt states he is also drinking hot tea w/ honey as needed and that helps as well. Pt states he will call back if he gets worse. Nothing further was needed

## 2010-08-19 NOTE — Telephone Encounter (Signed)
Pt phoned stated that he was returning a call he can be reached at [phone].Vedia Coffer

## 2010-08-19 NOTE — Telephone Encounter (Signed)
Called, spoke with pt.  States his nasal congestion has resolved - has not needed to use afrin.  Pains have resolved.  Cough is better - now prod with clear mucus.  He has already taken 2 small bottles of liq mucinex and delysm but still coughing some with clear mucus.  He is using hydromet every couple of days d/t constipation.  Drinking prune jucie for this which is helping.  He would like to know if he should buy more delsym and mucinex considering he has already used 2 small bottles or if CDY has any further recs.  Pls advise.  Thanks!

## 2010-08-19 NOTE — Telephone Encounter (Signed)
See previous phone note.  

## 2010-08-19 NOTE — Telephone Encounter (Signed)
He can continue Delsym and Mucinex as long as he feels they are helping. He might try just using the Delsym alone.

## 2010-08-29 ENCOUNTER — Telehealth: Payer: Self-pay | Admitting: Internal Medicine

## 2010-08-29 MED ORDER — HYDROCODONE-HOMATROPINE 5-1.5 MG/5ML PO SYRP: ORAL_SOLUTION | ORAL | Status: DC

## 2010-08-29 NOTE — Telephone Encounter (Signed)
Ok hydromet 200 ml,   No ref         1 teaspoon 4x daily as needed for cough

## 2010-08-29 NOTE — Telephone Encounter (Signed)
Spoke with pt and notified that rx was sent to pharm.  Pt verbalized understanding.

## 2010-08-29 NOTE — Telephone Encounter (Signed)
[facility]ed [facility].  LMOMTCB X1 for pt to make him aware

## 2010-08-29 NOTE — Telephone Encounter (Signed)
Spoke with pt.  He states that his cough has pretty much resolved.  Only has occ dry cough.  Wants to know if he can have a refill on hydromet just to have on hand in case the cough returns.  Please advise thanks No Known Allergies

## 2010-08-29 NOTE — Telephone Encounter (Signed)
Per CDY-okay to give hydromet #260ml 1 tsp every 6 hour as needed no refills.Vivianne Spence

## 2010-09-04 ENCOUNTER — Ambulatory Visit (INDEPENDENT_AMBULATORY_CARE_PROVIDER_SITE_OTHER): Payer: Medicare Other

## 2010-09-04 DIAGNOSIS — J309 Allergic rhinitis, unspecified: Secondary | ICD-10-CM

## 2010-09-09 NOTE — Assessment & Plan Note (Signed)
[facility]                             PULMONARY OFFICE NOTE   Blake Harrison                         MRN:          [id number]  DATE:12/24/2006                            DOB:          Feb 17, 1926    PROBLEM LIST:  1. Allergic rhinitis.  2. Tracheobronchitis.  3. Prostate cancer - seed implants Blake Harrison J. Blake Harrison, M.D.).   HISTORY:  He is going to the gym 6 days per week.  Feels allergy vaccine  works well for him.  No problems.  He had left total knee replacement in  2007 and had chest x-ray then so he did not feel he needed to come here.  He continues allergy vaccine at 1:10.   MEDICATIONS:  1. Aspirin.  2. Zestoretic.  3. Naprosyn p.r.n.   ALLERGIES:  No known drug allergies.   OBJECTIVE:  VITAL SIGNS:  Weight 139 pounds, blood pressure 110/78,  pulse 63, room air saturation 99%.  HEENT:  Nasal mucosa looks rather vascular without erosion, evidence of  bleeding, or significant mucous.  Otherwise nasopharynx is unremarkable.  There is no adenopathy.  Voice quality is normal.  CHEST:  Clear.  HEART:  Sounds regular and normal.   IMPRESSION:  Allergic rhinitis with good control.   PLAN:  Continue allergy vaccine and environmental precautions.  Schedule  return in 1 year, earlier p.r.n.     Blake D. Maple Hudson, MD, Blake Harrison, FACP  Electronically Signed    CDY/MedQ  DD: 12/26/2006  DT: 12/26/2006  Job #: [id number]   cc:   Blake Harrison, M.D.

## 2010-09-12 NOTE — H&P (Signed)
[place]  Patient:    Blake Harrison, Blake Harrison                         MRN: [id number] Adm. Date:  [id number] Disc. Date: [id number] Attending:  Ivor Messier CC:         Lenon Curt. Cassell Clement, M.D.   History and Physical  HISTORY:  This is a planned outpatient surgical readmission of this 77 year old white male admitted for cataract implant surgery of the left eye.  PRESENT ILLNESS:  This patient had noted deterioration of vision to 20/50 or less in each eye.  This is due to progressive cataract formation.  He was previously admitted to this hospital on 04/06/00 and had a successful extracapsular cataract extraction with primary insertion of posterior chamber intraocular lens implant of the right eye.  Vision improved so the patient was no longer wearing his glasses.  He now hopes for similar surgery of the left eye.  He signed informed consent and arrangements were made for outpatient admission at this time.  PAST MEDICAL HISTORY:  See old chart.  The patient is in stable general good health under the care of Dr. Chilton Si.  CURRENT MEDICATIONS:  Hydrochlorothiazide, Zestoretic, baby aspirin, vitamin E, stool softener.  The patient has discontinued aspirin a few days prior to this surgical procedure.  REVIEW OF SYSTEMS:  No cardiorespiratory complaints.  PHYSICAL EXAMINATION:  VITAL SIGNS:  As recorded on admission.  Blood pressure 139/73, pulse 57, respirations 16, temperature 98.  GENERAL:  The patient is a pleasant, well-nourished, well-developed white male in no acute ocular distress.  HEENT:  Eyes-visual acuity is 20/30 to 20/25 right eye.  Left eye visual acuity left eye is 20/40- to 20/50-.  Applanation tonometry 13 mm right eye, 16 left eye.  External ocular and slit lamp examination revealed the patient has a clear well-centered posterior chamber intraocular lens implant in the right eye and nuclear cataract formation in the left eye.   The fundus examination shows a clear vitreous attached retina with normal optic nerve, blood vessels and macula.  ADMISSION DIAGNOSES: 1. Senile cataract left eye. 2. Pseudophakia right eye.  SURGICAL PLAN:  Cataract implant surgery left eye at this time. DD:  07/06/00 TD:  07/06/00 Job: [id number]

## 2010-09-12 NOTE — H&P (Signed)
Blake Harrison, HELMS NO.:  [id number]   MEDICAL RECORD NO.:  [id number]           PATIENT TYPE:   LOCATION:                                 FACILITY:   PHYSICIAN:  Ellwood Dense, M.D.   DATE OF BIRTH:  06/16/1925   DATE OF ADMISSION:  DATE OF DISCHARGE:                                HISTORY & PHYSICAL   DATE OF SURGERY:  November 03, 2005   CHIEF COMPLAINT:  End-stage osteoarthritis of the left knee.   BRIEF HISTORY:  This is a 77 year old gentleman with a history of end-stage  osteoarthritis of his left knee.  He has undergone conservative treatment  without relief of his symptoms.  Due to his continued symptoms and decrease  in activity, he is now scheduled for total knee arthroplasty of the left  knee.  The surgery, risks, benefits and aftercare were discussed in detail  with the patient.  Questions were invited and answered.  Surgery was  scheduled.  He has had medical clearance by Dr. Frederik Pear, his medical  doctor, and surgery will go ahead as scheduled.   PAST MEDICAL HISTORY:   DRUG ALLERGIES:  NONE.   CURRENT MEDICATIONS:  1.  Lisinopril.  2.  Hydrochlorothiazide 20/12.5 one p.o. daily.  3.  Aspirin 81 mg one daily which he stopped 10 days ago.  4.  Vitamins.   PREVIOUS SURGERIES:  Include bilateral knee arthroscopes and prostate seed  implants.   SERIOUS MEDICAL ILLNESSES:  1.  Include hypertension.  2.  History of prostate cancer.   SOCIAL HISTORY:  The patient is married.  He is a retired Airline pilot.  He  does not smoke and drinks wine occasionally.   FAMILY HISTORY:  Negative.   REVIEW OF SYSTEMS:  CENTRAL NERVOUS SYSTEM:  Negative for headache, blurred  vision or dizziness.  PULMONARY:  Negative for shortness of breath, PND and  orthopnea.  CARDIOVASCULAR:  Positive for hypertension.  Negative for chest  pain or palpitation.  GI:  Negative for ulcers or hepatitis.  GU:  Positive  for history of prostate cancer.   PHYSICAL EXAM:   Reveals a well-developed, well-nourished gentleman in no  acute distress.  BP is 142/72, respirations 18 and pulse 72 and regular.  GENERAL APPEARANCE:  This is a well-developed, well-nourished gentleman in  no acute distress.  HEENT:  Head normocephalic.  Nose patent.  Ears patent.  Pupils equal,  round, reactive to light.  Throat without injection.  NECK:  Supple with slight loss of extension.  Negative adenopathy and  carotids are 2+ without bruit.  CHEST:  Clear to auscultation.  No rales or rhonchi.  Respirations 18.  HEART:  Regular rate and rhythm at 72 beats per minute without murmur.  ABDOMEN:  Soft with active bowel sounds, no masses or organomegaly.  NEUROLOGIC:  The patient is alert and oriented to time, place and person.  Cranial nerves II-XII grossly intact.  EXTREMITIES:  Show the left knee with a varus deformity with 2 degree  flexion contracture and further flexion to 125 degrees, dorsalis pedis and  posterior  tibialis pulses are 2+.   X-RAYS:  Show end-stage osteoarthritis with a varus deformity of the left  knee.   IMPRESSION:  End-stage osteoarthritis with a varus deformity of the left  knee.   PLAN:  Total knee arthroplasty, left knee.      Jaquelyn Bitter. Jannette Spanner, P.A.    ______________________________  Ellwood Dense, M.D.    SJC/MEDQ  D:  11/02/2005  T:  11/02/2005  Job:  [id number]

## 2010-09-12 NOTE — Discharge Summary (Signed)
NAMEALIM, CATTELL NO.:  [id number]   MEDICAL RECORD NO.:  [id number]          PATIENT TYPE:  INP   LOCATION:  1503                         FACILITY:  [facility]   PHYSICIAN:  Erasmo Leventhal, M.D.DATE OF BIRTH:  01-13-1926   DATE OF ADMISSION:  11/03/2005  DATE OF DISCHARGE:  11/07/2005                                 DISCHARGE SUMMARY   ADMISSION DIAGNOSIS:  End-stage osteoarthritis left knee.   DISCHARGE DIAGNOSIS:  End-stage osteoarthritis left knee.   PROCEDURE:  Total knee arthroplasty left knee.   BRIEF HISTORY:  This is a 77 year old gentleman with history of  osteoarthritis of his left knee who has undergone conservative treatment  without relief of his symptoms.  Due to his continued symptoms he was  scheduled for total knee arthroplasty.  The surgery risk, benefits and  aftercare were discussed in detail with the patient, questions invited and  answered.  He had medical clearance by Dr. Carles Collet, his medical doctor  and surgery to go ahead as scheduled.   LABORATORY DATA:  Admission CBC showed RBC's slightly low at 4.03,  hemoglobin and hematocrit slightly low at 12.9 and 37.9.  Hemoglobin and  hematocrit reached a low of 9.2 and 27.0 on November 06, 2005.  He had a  slightly elevated neutrophil count at 79, slightly low lymphocytes at 11.  Admission BMET showed slightly elevated potassium at 5.4, glucose elevated  at 122.  On November 04, 2005 he had a slightly elevated potassium at 3.5.  Repeat showed it to be 4.0  Hid had mild hyponatremia at 134 which was  corrected.  He had mildly elevated glucose's during admission to a high at  154, at discharge it was 120.  He did have a slightly elevated creatinine to  1.6 but this was corrected by discharge.  Calcium was slightly low at 8.2 at  discharge.  Total bilirubin on the initial visit was slightly elevated at  1.5, otherwise the liver function tests were normal.  Urinalysis was normal.   HOSPITAL  COURSE:  During the surgical episode it was difficult to get a  catheter in so urology consult was obtained and he was catheterized by the  urology service and the catheter was kept in for three days on order of the  urology service.  It was removed on the third postoperative day and he had  no problems urinating thereafter.  On the first postoperative day his vital  signs were stable.  Temperature to a max of 101.5.  Ins and outs were good.  Drain was removed without difficulty.  His BMET showed potassium high at  5.3.  It was rechecked and normal.  He was seen by urology service and plans  were made to leave the catheter in for three days.  On the second  postoperative day he was feeling pretty good.  Temperature to 100.9.  Vital  signs stable. Ins and outs were good.  Dressing was changed.  Wound was  benign.  Calves were negative.  His IV was switched to Hep-Lock and his PCA  was discontinued.  On the third postoperative day he was feeling okay.  His  vital signs were stable.  Temperature to 100.1.  Hemoglobin and hematocrit  were at 9.2 and 27.0. His dressing was changed.  Wound was benign.  Calves  were negative.  Lungs were clear.  Heart sounds were normal.  His Foley  catheter was discontinued which he tolerated well and on November 07, 2005 with  vital signs stable, patient afebrile, he was subsequently discharged home  for followup in the office.   CONDITION ON DISCHARGE:  Improved.   DISCHARGE MEDICATIONS:  1.  Percocet one to two q.4-6h. p.r.n. pain.  2.  Robaxin 500 mg one p.o. q.8h. p.r.n. spasm.  3.  Trinsicon one twice a day for anemia.  4.  Coumadin as directed by pharmacist.  5.  Continue home medications except aspirin.   FOLLOWUP:  Followup with Dr. Thomasena Edis in 10 days.  Followup with Dr. Vonita Moss  as directed by Dr. Vonita Moss.      Jaquelyn Bitter. Chabon, P.A.    ______________________________  Erasmo Leventhal, M.D.    SJC/MEDQ  D:  11/24/2005  T:  11/24/2005   Job:  [id number]

## 2010-09-12 NOTE — Op Note (Signed)
Blake Harrison, Blake Harrison                            ACCOUNT NO.:  [id number]   MEDICAL RECORD NO.:  [id number]                   PATIENT TYPE:  AMB   LOCATION:  [facility]                                 FACILITY:  [facility]   PHYSICIAN:  Maretta Bees. Vonita Moss, M.D.             DATE OF BIRTH:  18-Dec-1925   DATE OF PROCEDURE:  10/10/2003  DATE OF DISCHARGE:                                 OPERATIVE REPORT   PREOPERATIVE DIAGNOSES:  1. Hematuria.  2. History of prostatic carcinoma with radioactive seed implantation.  3. Suspected urethral stricture.   POSTOPERATIVE DIAGNOSES:  1. Hematuria.  2. History of prostatic carcinoma with radioactive seed implantation.  3. Suspected urethral stricture.   OPERATION/PROCEDURE:  1. Cystoscopy.  2. Urethral dilation.   SURGEON:  Maretta Bees. Vonita Moss, M.D.   ANESTHESIA:  General.   INDICATIONS:  This 77 year old gentleman has a history of TUR of the  prostate and also radioactive seed implantation to the prostate.  He has  recently had some initial hematuria and an NMP22 was negative.  I could not  cystoscope him in the office because he was felt to have a bulbous urethral  stricture.  He was brought to the ER today for further evaluation.   DESCRIPTION OF PROCEDURE:  The patient is brought to the operating room and  placed in the lithotomy position.  External genitalia were prepped and  draped in the usual fashion.  He was cystoscoped and I encountered a bulbous  urethral stricture.  I inserted a guidewire through the stricture and over  the guidewire dilated him with Heyman dilators from 16-24-French.  I then  inserted a 21-French cystoscope and the prostatic urethra was well resected  with some increased vascularity but not any pathologic lesions whatsoever.  The bladder had no stones, tumors or inflammatory lesions.  The bladder was  emptied, the scope removed.  The patient went to the recovery room in good  condition.   I believe his hematuria was due to  bleeding from some superficial blood  vessels that are nonpathologic and may be related to his urethral stricture  which was dilated.                                               Maretta Bees. Vonita Moss, M.D.    LJP/MEDQ  D:  10/10/2003  T:  10/10/2003  Job:  [id number]

## 2010-09-12 NOTE — H&P (Signed)
[place]. [facility]  Patient:    Blake Harrison, Blake Harrison                         MRN: [id number] Adm. Date:  [id number] Attending:  Ivor Messier CC:         Lenon Curt. Chilton Si, MD  Guadelupe Sabin, M.D.   History and Physical  CHIEF COMPLAINT: This was a planned outpatient surgical admission of this 77 year old white male, admitted for cataract implant surgery of the right eye.  HISTORY OF PRESENT ILLNESS: This patient was seen in my office on December 03, 1999 complaining of halos around lights, blurred vision with night driving. He had been previously told that he had cataract formation in both eyes. Examination revealed a visual acuity of 20/70 with best correction and 20/40 with refraction.  Slit lamp examination confirmed the presence of nuclear cataract formation in both eyes, with a slight brunescent tint.  The patient was given glasses and returned in October 2001 stating that his vision had again deteriorated but the glasses were not helping.  Examination revealed 20/50- in each eye and it was therefore elected to proceed with cataract implant surgery at this time.  The patient was given oral discussion and printed information concerning the procedure and its possible complications. He signed an informed consent and arrangements were made for his outpatient admission at this time.  PAST MEDICAL HISTORY: The patient is in stable general health, under the care of Dr. Chilton Si.  1. Hypertension.  2. Allergies.  3. He has had prostate cancer in the past.  CURRENT MEDICATIONS:  1. Zestoretic.  2. Vitamin E.  3. Allergy shots once a month.  REVIEW OF SYSTEMS: No cardiorespiratory complaints.  PHYSICAL EXAMINATION:  VITAL SIGNS: As recorded on admission, blood pressure was 140/80, temperature 98.2 degrees, pulse 56, and respirations 28.  GENERAL: The patient is a pleasant, well-developed, well-nourished white male in no acute distress.  HEENT:  Eyes reveal visual acuity as noted above.  Slit lamp examination shows nuclear cataract formation in both eyes.  Funduscopic examination shows clear vitreous, attached retina, and normal optic nerve, blood vessels, and macula.  CHEST: Lungs clear to auscultation and percussion.  HEART: Normal sinus rhythm.  No cardiomegaly.  No murmurs.  ABDOMEN: Negative.  EXTREMITIES: Negative.  ADMISSION DIAGNOSIS: Senile nuclear cataract, both eyes, right eye greater than left.  SURGICAL PLAN: Cataract implant surgery, right eye now and left eye later. DD:  04/06/00 TD:  04/06/00 Job: [id number]

## 2010-09-12 NOTE — Op Note (Signed)
Blake Harrison, Blake Harrison                ACCOUNT NO.:  [id number]   MEDICAL RECORD NO.:  [id number]          PATIENT TYPE:  INP   LOCATION:  0002                         FACILITY:  [facility]   PHYSICIAN:  Martina Sinner, MD DATE OF BIRTH:  [birth year]   DATE OF PROCEDURE:  11/03/2005  DATE OF DISCHARGE:                                 OPERATIVE REPORT   SURGEON:  Scott A. MacDiarmid, M.D.   ASSISTANT:  Dr. Marcia Brash.   PREOPERATIVE DIAGNOSIS:  Difficult Foley placement.   POSTOPERATIVE DIAGNOSIS:  Prostatic urethral stricture (11 French in  diameter and about 3 mm in length).   INDICATIONS FOR PROCEDURE:  This is a 77 year old gentleman with a history  of brachytherapy who is undergoing knee replacement by orthopedics.  At the  beginning the procedure, attempts at placing Foley catheter failed by the  nursing staff.  A urological consultation was sought.  The patient, at the  time of interview, was under anesthesia.   DESCRIPTION OF PROCEDURE:  The patient has already had Ancef as antibiotic.  He was under anesthesia.  He was placed in the supine position.  His  perineal area was prepped and draped in normal sterile fashion.  A 16-French  Coude catheter was inserted gently and was met with assistance.  Flexible  cystoscope was then performed.  An 11-French stricture was noted at the  level of the prostatic urethra about 3 mm in length.  A Glidewire was then  inserted and advanced with no difficulty into the bladder.  This was  followed by dilating the stricture with Heyman dilators, beginning at 65-  [place] up to the level of the 20-French.  The dilation proceeded with no  difficulty or significant resistance.  After dilating to 20 [place], the  flexible cystoscope could easily be advanced into the level of the bladder  with no difficulty.  Cystoscopy revealed bilateral ureteral orifices that  are patent and effluxing urine normally.  The bladder was mildly to  moderately trabeculated.   An 18-French Coude catheter was easily inserted  into the bladder and the balloon was inflated with 10 mL of water.  Clear  urine was draining.  The gallbladder was taken out and the procedure was  terminated without complications.  The patient remained stable throughout  the procedure and was handed over to the orthopedic service to further  continue the scheduled operative procedure.  Please note that Dr. Perley Jain  was immediately present and available for the entire procedure.     ______________________________  Rich Fuchs, M.D.      Martina Sinner, MD  Electronically Signed    SK/MEDQ  D:  11/03/2005  T:  11/03/2005  Job:  [id number]

## 2010-09-12 NOTE — Op Note (Signed)
[place]. [facility]  Patient:    Blake Harrison, Blake Harrison                         MRN: [id number] Proc. Date: 07/06/00 Adm. Date:  [id number] Disc. Date: [id number] Attending:  Ivor Messier CC:         Lenon Curt. Cassell Clement, M.D.   Operative Report  PREOPERATIVE DIAGNOSIS:  Senile cataract left eye.  POSTOPERATIVE DIAGNOSIS:  Senile cataract left eye.  OPERATION:  Planned extracapsular cataract extraction-phacoemulsification, primary insertion of posterior chamber intraocular lens implant.  SURGEON:  Guadelupe Sabin, M.D.  ASSISTANT:  Nurse.  ANESTHESIA:  Local 4% Xylocaine, 0.75 Marcaine, retrobulbar block, topical Tetracaine, intraocular Xylocaine.  Anesthesia standby required in this patient.  The patient was given intravenous Ditropan during the period of retrobulbar injection.  PROCEDURE:  After the patient was prepped and draped the speculum was inserted in the left eye.  The eye was turned downward and a superior rectus traction suture was placed.  Schiotz tonometry was recorded at 7-8 scale units with a 5.5 gm weight.  A peritomy was performed adjacent to the limbus from the 11 to 1 oclock position.  The corneoscleral junction was cleaned and the corneoscleral groove was made with a 45 degree super blade.  The anterior chamber was then entered with the 2.5 mm diamond keratome at the 12 oclock position and the 15 degree blade at the 2:30 position.  Using a bent 26 gauge needle on a Healon syringe a circular capsulorrhexis was begun and then completed with the Grabow forceps.  Hydrodissection and hydrodelineation were performed using 1% Xylocaine.  The 30 degree phacoemulsification tip was then inserted and there was slow emulsification of the lens nucleus.  Total ultrasonic time was two minutes, six seconds. Average power level was 15%.  Total amount of fluid used was 95 cc.  Following removal of the nucleus, the residual cortex was  aspirated with the irrigation aspiration tip.  The posterior capsule appeared intact with a brilliant red fundus reflex.  It was therefore elected to insert an [employer] medical optics SI40 MB silicone three piece posterior chamber intraocular lens implant with UV absorber.  Diopter strength was +22.00.  This was inserted into the anterior chamber with the McDonald forceps and then centered into the capsular bag using the [facility] lens rotator.  The lens appeared to be well-centered.  The Healon which had been used during the procedure was then aspirated and replaced with balanced salt solution and Miochol ophthalmic solution.  The operative incisions appeared to be self-sealing.  No sutures were required.  Maxitrol ointment was instilled in the conjunctival cul-de-sac and the light patch and protector shield were applied.  Duration of the procedure and anesthesia administration was 45 minutes.  The patient tolerated the procedure well in general and left the operating room for the recovery room in good condition. DD:  07/06/00 TD:  07/06/00 Job: [id number]

## 2010-09-12 NOTE — Op Note (Signed)
   NAMEROMUALD, MCCASLIN NO.:  [id number]   MEDICAL RECORD NO.:  [id number]                   PATIENT TYPE:  AMB   LOCATION:  ENDO                                 [facility]ACILITY:  [facility]   PHYSICIAN:  Georgiana Spinner, M.D.                 DATE OF BIRTH:  [birth year]   DATE OF PROCEDURE:  02/20/2002  DATE OF DISCHARGE:                                 OPERATIVE REPORT   PROCEDURE:  Colonoscopy with Erbe Argon photocoagulation of presumed  radiation proctitis.   ENDOSCOPIST:  Georgiana Spinner, M.D.   DESCRIPTION OF PROCEDURE:  With the patient mildly sedated in the left  lateral decubitus position, rectal exam was performed.  Subsequently, the  Olympus videoscopic colonoscope was inserted in the rectum, and we viewed  the rectum on direct and indirect view.  We saw a mild degree of proctitis  just proximal to the pectinate line, and this was photographed with a  straight scope and advanced under direct vision to the cecum identified by  the ileocecal valve and appendiceal orifice.  From this point, the  colonoscope was slowly withdrawn taking circumferential views of the entire  colonic mucosa stopping to photograph and occasional diverticulum seem along  the way until we reached the rectum at which point we placed the endoscope  into retroflexed view.  This was the only way we could well delineate the  changes of proctitis.  First a biopsy was taken of this area.  Subsequently,  the [facility] probe was passed and making sure we stay proximal  to the pectinate line.  This was eradicated.  Once good eradication  occurred, this was photographed.  The endoscope was straightened and  withdrawn.  The patient's vial signs and pulse oximetry remained stable.  The patient tolerated the procedure well without apparent complications.   FINDINGS:  Presumed radiation proctitis biopsied and eradicated using Erbe  Argon photocoagulator.   PLAN:  1. Await biopsy  report and clinical response.  2. The patient will call me for the results and follow up as an outpatient.                                               Georgiana Spinner, M.D.    GMO/MEDQ  D:  02/20/2002  T:  02/20/2002  Job:  [id number]   cc:   Lenon Curt. Chilton Si, M.D.   Gita Kudo, M.D.  Fax: [phone]   Maretta Bees. Vonita Moss, M.D.  [address]  [place]  [place] [address]  Fax: [phone]

## 2010-09-12 NOTE — Op Note (Signed)
[place]  Patient:    Blake Harrison, Blake Harrison                         MRN: [id number] Proc. Date: 04/06/00 Adm. Date:  [id number] Attending:  Ivor Messier CC:         Lenon Curt. Chilton Si, MD  Guadelupe Sabin, M.D.   Operative Report  PREOPERATIVE DIAGNOSIS: Senile nuclear cataract, right eye.  POSTOPERATIVE DIAGNOSIS: Senile nuclear cataract, right eye.  OPERATION/PROCEDURE: Planned extracapsular cataract extraction with primary insertion of posterior chamber intraocular lens implant.  SURGEON: Guadelupe Sabin, M.D.  ASSISTANT: Nurse.  ANESTHESIA: Local 4 xylocaine, 0.75% Marcaine retrobulbar block, topical tetracaine, intraocular xylocaine.  Anesthesia standby required in this elderly patient.  DESCRIPTION OF PROCEDURE: After the patient was prepped and draped a lid speculum was inserted in the right eye.  The eye was turned downward and a superior rectus traction suture placed.  Schiotz tenometry was recorded at 5 scale units with a 5.5 g weight.  A peritomy was performed adjacent to the limbus from the 11 oclock to 1 oclock position.  The corneoscleral junction was cleaned and a corneoscleral groove made with a 45 degree Superblade.  The anterior chamber was then entered with the 2.5 mm diamond keratome at the 12 oclock position and the 15 degree blade at the 2:30 oclock position.  Using a bent 26 gauge needle on a Healon syringe a circular capsulorrhexis was begun and then completed with the Grabo forceps.  Hydrodissection and hydrodelineation were performed using 1% xylocaine.  The 30 degree phacoemulsification tip was then inserted with slow controlled emulsification of the lens nucleus, which was rather firm.  Total ultrasonic time was 1 minute and 50 seconds.  Average power level was 21%.  Total amount of fluids used was 50 cc.  Following removal of the nucleus the residual cortex was aspirated with the irrigation-aspiration tip.   The posterior capsule appeared intact, with a brilliant red fundus reflex.  It was therefore elected to insert an Allergan medical optic [id number] silicone posterior chamber intraocular lens implant with UV absorber.  Diopter strength was +23.00.  This was inserted with the McDonald forceps into the anterior chamber and then centered into the capsular bag using the Lister lens rotator.  The lens appeared to be well centered.  The Healon which had been used during the procedure was then aspirated and replaced with balanced salt solution and Miochol ophthalmic solution to stimulate pupillary constriction.  The operative incision appeared to be self-sealing and no sutures were required.  Maxitrol ointment was instilled in the conjunctival cul-de-sac.  A light patch and protective shield were applied.  Duration of the procedure and anesthesia administration was 45 minutes.  The patient tolerated the procedure well in general and left the operating room for the recovery room in good condition. DD:  04/06/00 TD:  04/06/00 Job: [id number]

## 2010-09-12 NOTE — Op Note (Signed)
NAMEEILEEN, Blake Harrison NO.:  [id number]   MEDICAL RECORD NO.:  [id number]          PATIENT TYPE:  INP   LOCATION:  0002                         FACILITY:  [facility]   PHYSICIAN:  Erasmo Leventhal, M.D.DATE OF BIRTH:  September 30, 1925   DATE OF PROCEDURE:  11/03/2005  DATE OF DISCHARGE:                                 OPERATIVE REPORT   Prior to the surgical procedure, after spinal anesthetic was placed, the OR  circulator was unable to pass a Foley catheter.  I discussed this with  urologist, Dr. Lorin Picket MacDiarmid and Dr. Kathryne Eriksson was asked to place a  Foley catheter under Dr. Mina Marble direction.  This was done without  complication.   OPERATIVE DETAILS:  The patient was counseled in the holding area; correct  side was identified; IV was started; antibiotics were given.  He was taken  to the operating room, placed in lateral position, where his block was  administered.  He was placed in supine position, well-padded and bumped.  Initially, the OR circulator tried to attempt Foley catheter on two  attempts, but the patient had undergone radiation and seed implants in the  past, had a urethral stricture, more than likely.  I discussed this with Dr.  Alfredo Martinez, urologist, due to the fact that he was in the operating  room nearby.  Dr. Sherron Monday asked house officer, Kathryne Eriksson to attend  the patient and place the Foley catheter under his supervision.  At this  time, Dr. Marcia Brash performed placement of Foley catheter.  Refer to his note  for details.   Following this, all extremities were well-padded and bumped, the left lower  extremity __________  prepped and draped in sterile fashion.  An Esmarch  tourniquet was inflated to 300 mmHg.  A straight midline incision was made  in the skin and subcutaneous tissue.  Medial and lateral soft-tissue flaps  were developed.  Medial parapatellar arthrotomy was performed.  The patella  was retracted out of the  way laterally, but not everted.  End-stage  arthritis changes.  Cruciate ligaments were resected.  A starter hole was  made in the femoral canal.  It was irrigated until the effluent was clear.  The distal femur was cut at a 5 degree valgus cut with an 11 mm resection,  due to a 5 degree flexion contracture.  Distal femur was found to be a size  4.  Rotation marks were made and cut to fit a size 4.  Midline menisci were  removed under direct visualization.  Genicular vessels were coagulated.  The  tibial eminence was resected.  Proximal tibia was found to be a size 4.  Starter holes were made.  Canal was irrigated and step reamer was utilized.  A blunt intramedullary rod was then placed.  I initially chose a 0 degree  slope with a 10 mm cut, based upon the medial side and this was done.  Posteromedial and posterofemoral osteophytes were removed under direct  visualization.  At this time, the size 4 was applied, reamers, punch.  The  femoral box cut was then  prepared.   At this time, we checked the knee in flexion and extension were both tight  in both flexion and extension.  Tibial trials were removed and we took  another 4 mm cut off the proximal tibia.  Following this, we then did the  reamer and punch again and, with the trials in place, we now had an  excellent balance in flexion, extension, varus and valgus well-balanced  flexion extension gaps, and with a 2 mm insert, the patella was found to be  a size 38 and 10 mm was resected.  Locking holes were made.  __________  retracted and anatomic trials were then removed.  The knee was irrigated  with pulsatile lavage, utilizing modern cement technique, all components  were cemented in place, size 4 tibia, size 4 femur, with a 38 patella.  After all the cement had dried and trials were removed, with a 12.5 mm  tibial insert, we had better flexion extension gaps, varus and valgus  stress, __________  30 degrees of flexion.  We felt the knee  was well-  balanced.  The patellofemoral tract was anatomic and longitudinal alignment  appeared to be about a 5 degree valgus knee.   Trial was then removed and we again irrigated the knee with pulsatile  lavage, including a final 12.5 mm tibial insert.  The knee was again  checked, bone wax placed on exposed bony surfaces.  The knee was checked.  Good extension and flexion, varus and valgus stress.  The patellofemoral  tract was anatomic.  Two medium Hemovac drains were placed.  Sequential  closure layers were done, arthrotomy with Vicryl, subcu Vicryl, skin closed  with subcuticular Monocryl sutures.  Steri-Strips were applied, drains  hooked to suction.  Sterile dressing was applied to the knee and tourniquet  was deflated.  Normal circulation to the foot and ankle at the end of the  case.  The patient was given another gram of Ancef intravenously.   The patient tolerated the procedure well.  There were no complications.  Sponge and needle count were correct.   Help with surgical traction, implant placement and throughout the entire  case was Mr. Leilani Able, whose assistance was needed.           ______________________________  Erasmo Leventhal, M.D.     RAC/MEDQ  D:  11/03/2005  T:  11/03/2005  Job:  [id number]

## 2010-09-23 ENCOUNTER — Ambulatory Visit: Payer: Medicare Other | Admitting: Physical Medicine & Rehabilitation

## 2010-09-23 ENCOUNTER — Encounter: Payer: Medicare Other | Attending: Physical Medicine & Rehabilitation

## 2010-09-23 DIAGNOSIS — I69959 Hemiplegia and hemiparesis following unspecified cerebrovascular disease affecting unspecified side: Secondary | ICD-10-CM | POA: Insufficient documentation

## 2010-09-23 DIAGNOSIS — M216X9 Other acquired deformities of unspecified foot: Secondary | ICD-10-CM

## 2010-09-23 DIAGNOSIS — G609 Hereditary and idiopathic neuropathy, unspecified: Secondary | ICD-10-CM

## 2010-09-23 DIAGNOSIS — M6250 Muscle wasting and atrophy, not elsewhere classified, unspecified site: Secondary | ICD-10-CM | POA: Insufficient documentation

## 2010-09-24 NOTE — Assessment & Plan Note (Signed)
REASON FOR VISIT:  Numbness in hands and feet, problem with balance.  He got a stroke on January 28, 2010, causing left hemiparesis through inpatient rehabilitation and outpatient rehabilitation.  On exam noted to have bilateral hand and foot intrinsic atrophy as well as bilateral footdrop.  He had problems with balance even preceding with stroke, spasticity on the left side of the body related to stroke, but not on the right side.  EMG and CV showing mild delayed at median sensory conduction absent, left radial sensory absent, left superficial peroneal and moderately prolonged left ulnar sensory.  He had prolongation with small amplitude, left median motor response, absent response in his left peroneal motor, and normal left peroneal to tibialis anterior.  He had denervation 1+ positive sharp waves in the left TA, left abductor pollicis brevis, left first dorsal interosseous, but nothing more proximal.  Overall, impression was sensory motor polyneuropathy, mixed axonal, and demyelinating process.  He had a monoclonal peak and spike. He has had normal B12, essentially normal hemoglobin A1c.  He has been referred to Neurology.  PHYSICAL EXAMINATION:  VITAL SIGNS:  Blood pressure 113/60, pulse 65, respirations 18, and O2 sat 97% on room air. GENERAL:  A well-developed, well-nourished thin male in no acute distress.  Orientation x3.  Affect alert.  Gait is with a cane. MUSCULOSKELETAL:  He has 4/5 strength bilateral grips, 4/5 left biceps, 5/5 right biceps.  Left-sided lower extremity strength is 5 in the quads and hip flexors and a 3- in ankle dorsiflexors bilaterally.  He has hand intrinsic atrophy, full foot intrinsic atrophy.  IMPRESSION: 1. Cerebrovascular accident, left hemiparesis, largely improved. 2. Axonal sensory motor polyneuropathy.  Question etiology of his     polyneuropathy, not a diabetic.  No neurotoxic medications     identified.  I will get Neurology input.  I will see  him back in     about 2 months.     Erick Colace, M.D. Electronically Signed    AEK/MedQ D:  09/23/2010 16:45:59  T:  09/24/2010 03:37:32  Job #:  [id number]  cc:   Marlan Palau, M.D. Fax: [phone]  Dr. Ronne Binning

## 2010-10-02 ENCOUNTER — Ambulatory Visit (INDEPENDENT_AMBULATORY_CARE_PROVIDER_SITE_OTHER): Payer: Medicare Other

## 2010-10-02 DIAGNOSIS — J309 Allergic rhinitis, unspecified: Secondary | ICD-10-CM

## 2010-10-09 ENCOUNTER — Encounter (HOSPITAL_BASED_OUTPATIENT_CLINIC_OR_DEPARTMENT_OTHER): Payer: Medicare Other | Admitting: Oncology

## 2010-10-09 ENCOUNTER — Encounter: Payer: Medicare Other | Admitting: Oncology

## 2010-10-09 ENCOUNTER — Other Ambulatory Visit (HOSPITAL_COMMUNITY): Payer: Self-pay | Admitting: Oncology

## 2010-10-09 DIAGNOSIS — D472 Monoclonal gammopathy: Secondary | ICD-10-CM

## 2010-10-09 LAB — CBC WITH DIFFERENTIAL/PLATELET
EOS%: 1.5 % (ref 0.0–7.0)
Eosinophils Absolute: 0.1 10*3/uL (ref 0.0–0.5)
LYMPH%: 9.5 % — ABNORMAL LOW (ref 14.0–49.0)
MCH: 32.6 pg (ref 27.2–33.4)
MCV: 94.5 fL (ref 79.3–98.0)
MONO%: 9.1 % (ref 0.0–14.0)
NEUT#: 3.7 10*3/uL (ref 1.5–6.5)
Platelets: 215 10*3/uL (ref 140–400)
RBC: 3.43 10*6/uL — ABNORMAL LOW (ref 4.20–5.82)

## 2010-10-13 ENCOUNTER — Emergency Department (HOSPITAL_COMMUNITY)
Admission: EM | Admit: 2010-10-13 | Discharge: 2010-10-14 | Disposition: A | Discharge status: Home / Self Care | Payer: Medicare Other | Attending: Emergency Medicine | Admitting: Emergency Medicine

## 2010-10-13 DIAGNOSIS — Z8673 Personal history of transient ischemic attack (TIA), and cerebral infarction without residual deficits: Secondary | ICD-10-CM | POA: Insufficient documentation

## 2010-10-13 DIAGNOSIS — I10 Essential (primary) hypertension: Secondary | ICD-10-CM | POA: Insufficient documentation

## 2010-10-13 DIAGNOSIS — F411 Generalized anxiety disorder: Secondary | ICD-10-CM | POA: Insufficient documentation

## 2010-10-13 DIAGNOSIS — R339 Retention of urine, unspecified: Secondary | ICD-10-CM | POA: Insufficient documentation

## 2010-10-13 DIAGNOSIS — N400 Enlarged prostate without lower urinary tract symptoms: Secondary | ICD-10-CM | POA: Insufficient documentation

## 2010-10-13 LAB — BETA 2 MICROGLOBULIN, SERUM: Beta-2 Microglobulin: 3.49 mg/L — ABNORMAL HIGH (ref 1.01–1.73)

## 2010-10-13 LAB — LACTATE DEHYDROGENASE: LDH: 131 U/L (ref 94–250)

## 2010-10-13 LAB — IMMUNOFIXATION ELECTROPHORESIS
IgG (Immunoglobin G), Serum: 1170 mg/dL (ref 650–1600)
Total Protein, Serum Electrophoresis: 6.5 g/dL (ref 6.0–8.3)

## 2010-10-13 LAB — COMPREHENSIVE METABOLIC PANEL
AST: 23 U/L (ref 0–37)
BUN: 30 mg/dL — ABNORMAL HIGH (ref 6–23)
CO2: 26 mEq/L (ref 19–32)
Calcium: 9.5 mg/dL (ref 8.4–10.5)
Chloride: 103 mEq/L (ref 96–112)
Creatinine, Ser: 1.48 mg/dL — ABNORMAL HIGH (ref 0.50–1.35)
Glucose, Bld: 84 mg/dL (ref 70–99)

## 2010-10-13 LAB — SEDIMENTATION RATE: Sed Rate: 15 mm/hr (ref 0–16)

## 2010-10-13 LAB — KAPPA/LAMBDA LIGHT CHAINS: Kappa:Lambda Ratio: 1.33 (ref 0.26–1.65)

## 2010-10-14 ENCOUNTER — Other Ambulatory Visit (HOSPITAL_COMMUNITY): Payer: Self-pay | Admitting: Oncology

## 2010-10-14 LAB — URINALYSIS, ROUTINE W REFLEX MICROSCOPIC
Nitrite: POSITIVE — AB
Specific Gravity, Urine: 1.02 (ref 1.005–1.030)
Urobilinogen, UA: 0.2 mg/dL (ref 0.0–1.0)
pH: 6.5 (ref 5.0–8.0)

## 2010-10-14 LAB — URINE MICROSCOPIC-ADD ON

## 2010-10-15 LAB — CREATININE CLEARANCE, URINE, 24 HOUR: Creatinine Clearance: 38 mL/min — ABNORMAL LOW (ref 75–125)

## 2010-10-16 LAB — UIFE/LIGHT CHAINS/TP QN, 24-HR UR
Albumin, U: DETECTED
Free Lambda Excretion/Day: 6.9 mg/d
Free Lambda Lt Chains,Ur: 0.6 mg/dL (ref 0.02–0.67)
Free Lt Chn Excr Rate: 107.76 mg/d
Gamma Globulin, Urine: DETECTED — AB
Time: 24 hours
Total Protein, Urine-Ur/day: 245 mg/d — ABNORMAL HIGH (ref 10–140)
Volume, Urine: 1150 mL

## 2010-10-17 NOTE — Consult Note (Signed)
  NAMEGURNIE, DURIS NO.:  [id number]  MEDICAL RECORD NO.:  [id number]  LOCATION:  WLED                         FACILITY:  [facility]  PHYSICIAN:  Taseen Marasigan I. Patsi Sears, M.D.DATE OF BIRTH:  1925/07/01  DATE OF CONSULTATION: DATE OF DISCHARGE:                                CONSULTATION   SUBJECTIVE:  Mr. Prien is an 77 year old male with history of TUR of prostate and also radioactive seed implantation for prostate cancer in the distant past.  The patient has been followed by Dr. Vonita Moss and was last evaluated by Dr. Vonita Moss in October 2011.  His radioactive seed implantation for prostate cancer was in 1998.  The patient was admitted with CVA and left-sided weakness and urinary retention in 2010, was catheterized for 700 cc.  In November 2011, the patient presented to the [facility] Emergency Room with acute urinary retention requiring flexible cystoscopy, proximal urethral dilation of the prostatic urethra, and catheter placement.  He presents tonight with urinary retention again.  He has not had anything to drink this evening, however.  The patient has urology consultation because of difficulty with previous catheterization.  PAST SURGICAL HISTORY: 1. Left Baker's cyst. 2. Cataract surgery. 3. Left total knee surgery. 4. Prostate cancer surgery as noted above.  HOME MEDICATIONS:  Included lisinopril, Celexa, Valium, Norvasc, aspirin, naproxen, vitamin D.  He also takes HCTZ.Marland Kitchen  ALLERGIES:  None known.  SOCIAL HISTORY:  Tobacco, none.  Alcohol, occasionally.  FAMILY HISTORY:  Noncontributory.  SOCIAL HISTORY:  The patient is married.  Lives with his wife.  REVIEW OF SYMPTOMS:  Constitutional review of systems is significant for no suprapubic discomfort or pain.  No dysuria.  No fever, no chills.  PHYSICAL EXAMINATION:  GENERAL:  Physical exam shows a well-developed white male, in no acute distress. VITAL SIGNS:  His vital signs are  stable. ABDOMEN:  Examination shows abdomen is soft.  Positive bowel sounds.  No organomegaly or masses.  Suprapubic area is benign, soft and smooth and flat. GU:  Penis is normal.  Testicles are descended bilaterally.  Penis is circumcised. EXTREMITIES:  Showed no cyanosis, no edema.  IMPRESSION:  The patient voids approximately 100 cc of dark urine.  He feels relieved.  He would like to wait and see Dr. Vonita Moss tomorrow morning, so I will hold off on catheterization tonight.  Note that when the patient came in the emergency room, his complaint was urinary retention, and note that the patient was hospitalized and treated for acute stroke with left hemiparesis and discharged 6 days ago, with catheterization per Dr. Vonita Moss at that time for urinary retention with urinary tract infection, hemorrhagic cystitis.  He has been treated with ciprofloxacin.  We will plan to allow the patient to be discharged, to Dr. Vonita Moss in the morning.     Cora Stetson I. Patsi Sears, M.D.     SIT/MEDQ  D:  10/14/2010  T:  10/14/2010  Job:  [id number]  cc:   Maretta Bees. Vonita Moss, M.D. Fax: [phone]  Electronically Signed by Jethro Bolus M.D. on 10/17/2010 01:19:38 PM

## 2010-10-30 ENCOUNTER — Ambulatory Visit (INDEPENDENT_AMBULATORY_CARE_PROVIDER_SITE_OTHER): Payer: Medicare Other

## 2010-10-30 DIAGNOSIS — J309 Allergic rhinitis, unspecified: Secondary | ICD-10-CM

## 2010-11-13 ENCOUNTER — Encounter: Payer: Self-pay | Admitting: Internal Medicine

## 2010-11-27 ENCOUNTER — Emergency Department (HOSPITAL_COMMUNITY)
Admission: EM | Admit: 2010-11-27 | Discharge: 2010-11-27 | Disposition: A | Discharge status: Home / Self Care | Payer: Medicare Other | Attending: Emergency Medicine | Admitting: Emergency Medicine

## 2010-11-27 DIAGNOSIS — C61 Malignant neoplasm of prostate: Secondary | ICD-10-CM | POA: Insufficient documentation

## 2010-11-27 DIAGNOSIS — Z8673 Personal history of transient ischemic attack (TIA), and cerebral infarction without residual deficits: Secondary | ICD-10-CM | POA: Insufficient documentation

## 2010-11-27 DIAGNOSIS — K5900 Constipation, unspecified: Secondary | ICD-10-CM | POA: Insufficient documentation

## 2010-11-27 DIAGNOSIS — I10 Essential (primary) hypertension: Secondary | ICD-10-CM | POA: Insufficient documentation

## 2010-11-27 DIAGNOSIS — Z96659 Presence of unspecified artificial knee joint: Secondary | ICD-10-CM | POA: Insufficient documentation

## 2010-11-27 DIAGNOSIS — F341 Dysthymic disorder: Secondary | ICD-10-CM | POA: Insufficient documentation

## 2010-11-28 ENCOUNTER — Ambulatory Visit (INDEPENDENT_AMBULATORY_CARE_PROVIDER_SITE_OTHER): Payer: Medicare Other

## 2010-11-28 DIAGNOSIS — J309 Allergic rhinitis, unspecified: Secondary | ICD-10-CM

## 2010-12-09 ENCOUNTER — Other Ambulatory Visit (HOSPITAL_COMMUNITY): Payer: Self-pay | Admitting: Oncology

## 2010-12-09 ENCOUNTER — Encounter (HOSPITAL_BASED_OUTPATIENT_CLINIC_OR_DEPARTMENT_OTHER): Payer: Medicare Other | Admitting: Oncology

## 2010-12-09 DIAGNOSIS — D472 Monoclonal gammopathy: Secondary | ICD-10-CM

## 2010-12-09 LAB — CBC WITH DIFFERENTIAL/PLATELET
Basophils Absolute: 0 10*3/uL (ref 0.0–0.1)
Eosinophils Absolute: 0 10*3/uL (ref 0.0–0.5)
HGB: 12.6 g/dL — ABNORMAL LOW (ref 13.0–17.1)
LYMPH%: 6.2 % — ABNORMAL LOW (ref 14.0–49.0)
MCV: 94.3 fL (ref 79.3–98.0)
MONO#: 0.4 10*3/uL (ref 0.1–0.9)
MONO%: 5.7 % (ref 0.0–14.0)
NEUT#: 5.6 10*3/uL (ref 1.5–6.5)
Platelets: 266 10*3/uL (ref 140–400)
RDW: 12.6 % (ref 11.0–14.6)

## 2010-12-10 LAB — KAPPA/LAMBDA LIGHT CHAINS
Kappa:Lambda Ratio: 1.22 (ref 0.26–1.65)
Lambda Free Lght Chn: 2.31 mg/dL (ref 0.57–2.63)

## 2010-12-10 LAB — COMPREHENSIVE METABOLIC PANEL
ALT: 14 U/L (ref 0–53)
AST: 19 U/L (ref 0–37)
Alkaline Phosphatase: 54 U/L (ref 39–117)
CO2: 27 mEq/L (ref 19–32)
Sodium: 131 mEq/L — ABNORMAL LOW (ref 135–145)
Total Bilirubin: 1 mg/dL (ref 0.3–1.2)
Total Protein: 6.7 g/dL (ref 6.0–8.3)

## 2010-12-10 LAB — LACTATE DEHYDROGENASE: LDH: 149 U/L (ref 94–250)

## 2010-12-10 LAB — IGG, IGA, IGM: IgG (Immunoglobin G), Serum: 1160 mg/dL (ref 650–1600)

## 2010-12-12 ENCOUNTER — Other Ambulatory Visit (HOSPITAL_COMMUNITY): Payer: Self-pay | Admitting: Oncology

## 2010-12-12 DIAGNOSIS — R634 Abnormal weight loss: Secondary | ICD-10-CM

## 2010-12-12 LAB — FECAL OCCULT BLOOD, GUAIAC: Occult Blood: NEGATIVE

## 2010-12-23 ENCOUNTER — Ambulatory Visit: Payer: Medicare Other | Admitting: Physical Medicine & Rehabilitation

## 2010-12-25 ENCOUNTER — Ambulatory Visit: Payer: Medicare Other | Admitting: Physical Medicine & Rehabilitation

## 2010-12-25 ENCOUNTER — Encounter: Payer: Medicare Other | Attending: Physical Medicine & Rehabilitation

## 2010-12-25 ENCOUNTER — Ambulatory Visit (INDEPENDENT_AMBULATORY_CARE_PROVIDER_SITE_OTHER): Payer: Medicare Other

## 2010-12-25 DIAGNOSIS — M216X9 Other acquired deformities of unspecified foot: Secondary | ICD-10-CM

## 2010-12-25 DIAGNOSIS — J309 Allergic rhinitis, unspecified: Secondary | ICD-10-CM

## 2010-12-25 DIAGNOSIS — I69959 Hemiplegia and hemiparesis following unspecified cerebrovascular disease affecting unspecified side: Secondary | ICD-10-CM | POA: Insufficient documentation

## 2010-12-25 DIAGNOSIS — G608 Other hereditary and idiopathic neuropathies: Secondary | ICD-10-CM | POA: Insufficient documentation

## 2010-12-25 DIAGNOSIS — M6250 Muscle wasting and atrophy, not elsewhere classified, unspecified site: Secondary | ICD-10-CM | POA: Insufficient documentation

## 2010-12-25 NOTE — Assessment & Plan Note (Signed)
HISTORY:  An 77 year old male with a prior CVA causing left hemiparesis, subcortical infarct, but was noted have bilateral foot drop, bilateral hand wasting.  EMG and CV showing sensory motor polyneuropathy, mixed axonal demyelinating process.  He had a monoclonal peak and spike on serum protein electrophoresis test.  Normal B12 and normal hemoglobin A1c has been referred from Neurology to Hematology apparently for the paraproteinemia.  The patient has had no further decline in his function.  He drives.  He is independent with all self-care, uses a cane at times.  PHYSICAL EXAMINATION:  His strength is 4/5 bilateral grip, left biceps is 4, right biceps is 5, bilateral lower extremity 5/5 on the quad and hip flexors, 3- ankle dorsiflexors.  He does have hand intrinsic atrophy.  He has decreased deep tendon reflexes bilateral ankles, intact at the knees.  IMPRESSION: 1. Cerebrovascular accident, left hemiparesis is largely improved. 2. Axonal sensory motor polyneuropathy, not a diabetic, may be related     to paraproteinemia seeing hematology.  PLAN:  I will see him back in a p.r.n. basis since functionally he has had a plateau.  Certainly if he decline, I will be happy to reevaluate. I discussed with the patient and agrees with plan.     Erick Colace, M.D. Electronically Signed    AEK/MedQ D:  12/25/2010 13:20:43  T:  12/25/2010 14:02:39  Job #:  [id number]  cc:   Samul Dada, M.D. Fax: [phone]  C. Lesia Sago, M.D. Fax: [phone]

## 2011-01-07 ENCOUNTER — Other Ambulatory Visit: Payer: Self-pay | Admitting: Internal Medicine

## 2011-01-07 NOTE — Telephone Encounter (Signed)
Please advise if okay to refill. Thanks.  

## 2011-01-08 NOTE — Telephone Encounter (Signed)
Ok to refill once

## 2011-01-22 ENCOUNTER — Ambulatory Visit (INDEPENDENT_AMBULATORY_CARE_PROVIDER_SITE_OTHER): Payer: Medicare Other

## 2011-01-22 DIAGNOSIS — J309 Allergic rhinitis, unspecified: Secondary | ICD-10-CM

## 2011-01-26 ENCOUNTER — Other Ambulatory Visit: Payer: Self-pay | Admitting: Dermatology

## 2011-01-27 ENCOUNTER — Ambulatory Visit (INDEPENDENT_AMBULATORY_CARE_PROVIDER_SITE_OTHER): Payer: Medicare Other

## 2011-01-27 DIAGNOSIS — Z23 Encounter for immunization: Secondary | ICD-10-CM

## 2011-02-19 ENCOUNTER — Ambulatory Visit (INDEPENDENT_AMBULATORY_CARE_PROVIDER_SITE_OTHER): Payer: Medicare Other

## 2011-02-19 DIAGNOSIS — J309 Allergic rhinitis, unspecified: Secondary | ICD-10-CM

## 2011-03-17 ENCOUNTER — Ambulatory Visit (INDEPENDENT_AMBULATORY_CARE_PROVIDER_SITE_OTHER): Payer: Medicare Other

## 2011-03-17 DIAGNOSIS — J309 Allergic rhinitis, unspecified: Secondary | ICD-10-CM

## 2011-03-23 ENCOUNTER — Other Ambulatory Visit: Payer: Self-pay | Admitting: Dermatology

## 2011-04-16 ENCOUNTER — Ambulatory Visit (INDEPENDENT_AMBULATORY_CARE_PROVIDER_SITE_OTHER): Payer: Medicare Other

## 2011-04-16 DIAGNOSIS — J309 Allergic rhinitis, unspecified: Secondary | ICD-10-CM

## 2011-05-19 ENCOUNTER — Ambulatory Visit (INDEPENDENT_AMBULATORY_CARE_PROVIDER_SITE_OTHER): Payer: Medicare Other

## 2011-05-19 DIAGNOSIS — J309 Allergic rhinitis, unspecified: Secondary | ICD-10-CM

## 2011-06-15 ENCOUNTER — Ambulatory Visit (INDEPENDENT_AMBULATORY_CARE_PROVIDER_SITE_OTHER): Payer: Medicare Other

## 2011-06-15 DIAGNOSIS — J309 Allergic rhinitis, unspecified: Secondary | ICD-10-CM

## 2011-06-16 ENCOUNTER — Ambulatory Visit (INDEPENDENT_AMBULATORY_CARE_PROVIDER_SITE_OTHER): Payer: Medicare Other

## 2011-06-16 DIAGNOSIS — J309 Allergic rhinitis, unspecified: Secondary | ICD-10-CM

## 2011-06-30 ENCOUNTER — Encounter: Payer: Self-pay | Admitting: Internal Medicine

## 2011-06-30 ENCOUNTER — Ambulatory Visit (INDEPENDENT_AMBULATORY_CARE_PROVIDER_SITE_OTHER): Payer: Medicare Other | Admitting: Internal Medicine

## 2011-06-30 VITALS — BP 146/80 | HR 61 | Ht 69.0 in | Wt 140.8 lb

## 2011-06-30 DIAGNOSIS — J309 Allergic rhinitis, unspecified: Secondary | ICD-10-CM

## 2011-06-30 NOTE — Progress Notes (Signed)
    Patient ID: Blake Harrison, male    DOB: 07/15/1925, 78 y.o.   MRN: [id number]  HPI 78 yo never smoker, followed for allergic rhinitis, hx of tracheobronchitis, prostate cancer and CVA. Last here- June 30, 2010. He gets allergy vaccine here every 4 weeks. The day after his last shot, about 1 week ago, he got irritated throat, nasal congestion, much productive wheezey cough, tussive soreness in sternum. Denies, fever, and he doubts infection.   06/30/11- 34 yo never smoker, followed for allergic rhinitis, hx of tracheobronchitis, prostate cancer and CVA No allergy problems so far this spring. Continues allergy vaccine one time per month. Takes lisinopril with no recognized side effects. Has bronchitis only with colds and had no significant respiratory infection this winter.  ROS-see HPI Constitutional:   No-   weight loss, night sweats, fevers, chills, fatigue, lassitude. HEENT:   No-  headaches, difficulty swallowing, tooth/dental problems, sore throat,       No-  sneezing, itching, ear ache, nasal congestion, post nasal drip,  CV:  No-   chest pain, orthopnea, PND, swelling in lower extremities, anasarca, dizziness, palpitations Resp: No-   shortness of breath with exertion or at rest.              No-   productive cough,  No non-productive cough,  No- coughing up of blood.              No-   change in color of mucus.  No- wheezing.   Skin: No-   rash or lesions. GI:  No-   heartburn, indigestion, abdominal pain, nausea, vomiting, diarrhea,                 change in bowel habits, loss of appetite GU:  MS:  No-   joint pain or swelling.    No- back pain. Neuro-     nothing unusual Psych:  No- change in mood or affect. No depression or anxiety.  No memory loss.     OBJ- Physical Exam General- Alert, Oriented, Affect-appropriate, Distress- none acute Skin- rash-none, lesions- none, excoriation- none Lymphadenopathy- none Head- atraumatic            Eyes- Gross vision intact, PERRLA,  conjunctivae and secretions clear            Ears- Hearing, canals-normal            Nose- Clear, no-Septal dev, mucus, polyps, erosion, perforation             Throat- Mallampati II , mucosa clear , drainage- none, tonsils- atrophic Neck- flexible , trachea midline, no stridor , thyroid nl, carotid no bruit Chest - symmetrical excursion , unlabored           Heart/CV- RRR , no murmur , no gallop  , no rub, nl s1 s2                           - JVD- none , edema- none, stasis changes- none, varices- none           Lung- clear to P&A, wheeze- none, cough- none , dullness-none, rub- none           Chest wall-  Abd-  Br/ Gen/ Rectal- Not done, not indicated Extrem- cyanosis- none, clubbing, none, atrophy- none, strength- nl Neuro- Left hemiparesis, uses cane

## 2011-06-30 NOTE — Patient Instructions (Signed)
Continue allergy vaccine  Ok to supplement with an otc non-sedating antihistamine like Claritin/ loratadine, or Allegra/ fexofenadine if needed

## 2011-07-03 NOTE — Assessment & Plan Note (Signed)
Good control. He is satisfied to continue allergy vaccine. He can supplement with antihistamines if needed.

## 2011-07-14 ENCOUNTER — Ambulatory Visit (INDEPENDENT_AMBULATORY_CARE_PROVIDER_SITE_OTHER): Payer: Medicare Other

## 2011-07-14 DIAGNOSIS — J309 Allergic rhinitis, unspecified: Secondary | ICD-10-CM

## 2011-08-11 ENCOUNTER — Ambulatory Visit (INDEPENDENT_AMBULATORY_CARE_PROVIDER_SITE_OTHER): Payer: Medicare Other

## 2011-08-11 DIAGNOSIS — J309 Allergic rhinitis, unspecified: Secondary | ICD-10-CM

## 2011-09-08 ENCOUNTER — Ambulatory Visit (INDEPENDENT_AMBULATORY_CARE_PROVIDER_SITE_OTHER): Payer: Medicare Other

## 2011-09-08 DIAGNOSIS — J309 Allergic rhinitis, unspecified: Secondary | ICD-10-CM

## 2011-09-28 ENCOUNTER — Other Ambulatory Visit: Payer: Self-pay | Admitting: Dermatology

## 2011-10-06 ENCOUNTER — Ambulatory Visit (INDEPENDENT_AMBULATORY_CARE_PROVIDER_SITE_OTHER): Payer: Medicare Other

## 2011-10-06 DIAGNOSIS — J309 Allergic rhinitis, unspecified: Secondary | ICD-10-CM

## 2011-11-03 ENCOUNTER — Ambulatory Visit (INDEPENDENT_AMBULATORY_CARE_PROVIDER_SITE_OTHER): Payer: Medicare Other

## 2011-11-03 DIAGNOSIS — J309 Allergic rhinitis, unspecified: Secondary | ICD-10-CM

## 2011-11-09 ENCOUNTER — Other Ambulatory Visit: Payer: Self-pay | Admitting: Dermatology

## 2011-11-11 ENCOUNTER — Encounter: Payer: Self-pay | Admitting: Internal Medicine

## 2011-12-01 ENCOUNTER — Ambulatory Visit (INDEPENDENT_AMBULATORY_CARE_PROVIDER_SITE_OTHER): Payer: Medicare Other

## 2011-12-01 DIAGNOSIS — J309 Allergic rhinitis, unspecified: Secondary | ICD-10-CM

## 2011-12-02 ENCOUNTER — Other Ambulatory Visit: Payer: Self-pay

## 2011-12-30 ENCOUNTER — Ambulatory Visit (INDEPENDENT_AMBULATORY_CARE_PROVIDER_SITE_OTHER): Payer: Medicare Other

## 2011-12-30 DIAGNOSIS — J309 Allergic rhinitis, unspecified: Secondary | ICD-10-CM

## 2012-01-26 ENCOUNTER — Ambulatory Visit (INDEPENDENT_AMBULATORY_CARE_PROVIDER_SITE_OTHER): Payer: Medicare Other

## 2012-01-26 DIAGNOSIS — J309 Allergic rhinitis, unspecified: Secondary | ICD-10-CM

## 2012-01-26 DIAGNOSIS — Z23 Encounter for immunization: Secondary | ICD-10-CM

## 2012-01-27 DIAGNOSIS — Z23 Encounter for immunization: Secondary | ICD-10-CM

## 2012-02-24 ENCOUNTER — Ambulatory Visit (INDEPENDENT_AMBULATORY_CARE_PROVIDER_SITE_OTHER): Payer: Medicare Other

## 2012-02-24 DIAGNOSIS — J309 Allergic rhinitis, unspecified: Secondary | ICD-10-CM

## 2012-02-29 ENCOUNTER — Other Ambulatory Visit: Payer: Self-pay | Admitting: Dermatology

## 2012-03-22 ENCOUNTER — Ambulatory Visit (INDEPENDENT_AMBULATORY_CARE_PROVIDER_SITE_OTHER): Payer: Medicare Other

## 2012-03-22 DIAGNOSIS — J309 Allergic rhinitis, unspecified: Secondary | ICD-10-CM

## 2012-04-21 ENCOUNTER — Ambulatory Visit (INDEPENDENT_AMBULATORY_CARE_PROVIDER_SITE_OTHER): Payer: Medicare Other

## 2012-04-21 DIAGNOSIS — J309 Allergic rhinitis, unspecified: Secondary | ICD-10-CM

## 2012-05-17 ENCOUNTER — Ambulatory Visit (INDEPENDENT_AMBULATORY_CARE_PROVIDER_SITE_OTHER): Payer: Medicare Other

## 2012-05-17 DIAGNOSIS — J309 Allergic rhinitis, unspecified: Secondary | ICD-10-CM

## 2012-06-14 ENCOUNTER — Ambulatory Visit: Payer: Medicare Other

## 2012-06-20 ENCOUNTER — Ambulatory Visit (INDEPENDENT_AMBULATORY_CARE_PROVIDER_SITE_OTHER): Payer: Medicare PPO

## 2012-06-20 DIAGNOSIS — J309 Allergic rhinitis, unspecified: Secondary | ICD-10-CM

## 2012-06-27 ENCOUNTER — Ambulatory Visit (INDEPENDENT_AMBULATORY_CARE_PROVIDER_SITE_OTHER): Payer: Medicare PPO

## 2012-06-27 DIAGNOSIS — J309 Allergic rhinitis, unspecified: Secondary | ICD-10-CM

## 2012-06-30 ENCOUNTER — Encounter: Payer: Self-pay | Admitting: Internal Medicine

## 2012-06-30 ENCOUNTER — Ambulatory Visit (INDEPENDENT_AMBULATORY_CARE_PROVIDER_SITE_OTHER): Payer: Medicare PPO | Admitting: Internal Medicine

## 2012-06-30 VITALS — BP 126/62 | HR 77 | Ht 69.0 in | Wt 149.6 lb

## 2012-06-30 DIAGNOSIS — J309 Allergic rhinitis, unspecified: Secondary | ICD-10-CM

## 2012-06-30 DIAGNOSIS — J302 Other seasonal allergic rhinitis: Secondary | ICD-10-CM

## 2012-06-30 NOTE — Patient Instructions (Addendum)
We can continue allergy vaccine 1:10  GH, given monthly  Please call as needed

## 2012-06-30 NOTE — Progress Notes (Signed)
Patient ID: Blake Harrison, male    DOB: [birth year], 79 y.o.   MRN: [id number]  HPI 79 yo never smoker, followed for allergic rhinitis, hx of tracheobronchitis, prostate cancer and CVA. Last here- June 30, 2010. He gets allergy vaccine here every 4 weeks. The day after his last shot, about 1 week ago, he got irritated throat, nasal congestion, much productive wheezey cough, tussive soreness in sternum. Denies, fever, and he doubts infection.   06/30/11- 32 yo never smoker, followed for allergic rhinitis, hx of tracheobronchitis, prostate cancer and CVA No allergy problems so far this spring. Continues allergy vaccine one time per month. Takes lisinopril with no recognized side effects. Has bronchitis only with colds and had no significant respiratory infection this winter.  06/30/12- 21 yo never smoker, followed for allergic rhinitis, hx of tracheobronchitis, prostate cancer and CVA FOLLOWS FOR: still on vaccine and no troubles Doing very well with no significant resp infection this winter.  Allergy vaccine injections once per month 1:10 GH with no problems. He feels they continue to help, but I explained they could be stopped at any time.  ROS-see HPI Constitutional:   No-   weight loss, night sweats, fevers, chills, fatigue, lassitude. HEENT:   No-  headaches, difficulty swallowing, tooth/dental problems, sore throat,       No-  sneezing, itching, ear ache, nasal congestion, post nasal drip,  CV:  No-   chest pain, orthopnea, PND, swelling in lower extremities, anasarca, dizziness, palpitations Resp: No-   shortness of breath with exertion or at rest.              No-   productive cough,  No non-productive cough,  No- coughing up of blood.              No-   change in color of mucus.  No- wheezing.   Skin: No-   rash or lesions. GI:  No-   heartburn, indigestion, abdominal pain, nausea, vomiting,  GU:  MS:  No-   joint pain or swelling.    No- back pain. Neuro-     nothing changed Psych:  No-  change in mood or affect. No depression or anxiety.  No memory loss.     OBJ- Physical Exam General- Alert, Oriented, Affect-appropriate, Distress- none acute Skin- rash-none, lesions- none, excoriation- none Lymphadenopathy- none Head- atraumatic            Eyes- Gross vision intact, PERRLA, conjunctivae and secretions clear            Ears- Hearing, canals-normal. Non-obstructing cerumen.            Nose- Clear, no-Septal dev, mucus, polyps, erosion, perforation             Throat- Mallampati II , mucosa clear , drainage- none, tonsils- atrophic Neck- flexible , trachea midline, no stridor , thyroid nl, carotid no bruit Chest - symmetrical excursion , unlabored           Heart/CV- RRR , no murmur , no gallop  , no rub, nl s1 s2                           - JVD- none , edema- none, stasis changes- none, varices- none           Lung- clear to P&A, wheeze- none, cough- none , dullness-none, rub- none           Chest wall-  Abd-  Br/ Gen/ Rectal- Not done, not indicated Extrem- cyanosis- none, clubbing, none, atrophy- none, strength- nl Neuro- Left hemiparesis, uses cane

## 2012-07-01 NOTE — Assessment & Plan Note (Signed)
Ok to continue allergy vaccine since he feels they help still. We discussed risk, goals and realistic expectations.

## 2012-07-18 ENCOUNTER — Ambulatory Visit (INDEPENDENT_AMBULATORY_CARE_PROVIDER_SITE_OTHER): Payer: Medicare PPO

## 2012-07-18 DIAGNOSIS — J309 Allergic rhinitis, unspecified: Secondary | ICD-10-CM

## 2012-07-21 ENCOUNTER — Other Ambulatory Visit: Payer: Self-pay

## 2012-07-28 ENCOUNTER — Other Ambulatory Visit: Payer: Self-pay

## 2012-08-01 ENCOUNTER — Encounter: Payer: Self-pay | Admitting: Internal Medicine

## 2012-08-17 ENCOUNTER — Ambulatory Visit (INDEPENDENT_AMBULATORY_CARE_PROVIDER_SITE_OTHER): Payer: Medicare PPO

## 2012-08-17 DIAGNOSIS — J309 Allergic rhinitis, unspecified: Secondary | ICD-10-CM

## 2012-09-12 ENCOUNTER — Ambulatory Visit (INDEPENDENT_AMBULATORY_CARE_PROVIDER_SITE_OTHER): Payer: Medicare PPO

## 2012-09-12 DIAGNOSIS — J309 Allergic rhinitis, unspecified: Secondary | ICD-10-CM

## 2012-10-10 ENCOUNTER — Ambulatory Visit (INDEPENDENT_AMBULATORY_CARE_PROVIDER_SITE_OTHER): Payer: Medicare PPO

## 2012-10-10 DIAGNOSIS — J309 Allergic rhinitis, unspecified: Secondary | ICD-10-CM

## 2012-10-20 ENCOUNTER — Other Ambulatory Visit: Payer: Self-pay

## 2012-11-07 ENCOUNTER — Ambulatory Visit: Payer: Medicare PPO

## 2012-11-11 ENCOUNTER — Ambulatory Visit (INDEPENDENT_AMBULATORY_CARE_PROVIDER_SITE_OTHER): Payer: Medicare PPO

## 2012-11-11 DIAGNOSIS — J309 Allergic rhinitis, unspecified: Secondary | ICD-10-CM

## 2012-11-18 ENCOUNTER — Ambulatory Visit: Payer: Medicare PPO

## 2012-12-08 ENCOUNTER — Ambulatory Visit (INDEPENDENT_AMBULATORY_CARE_PROVIDER_SITE_OTHER): Payer: Medicare PPO

## 2012-12-08 DIAGNOSIS — J309 Allergic rhinitis, unspecified: Secondary | ICD-10-CM

## 2012-12-15 ENCOUNTER — Ambulatory Visit: Payer: Medicare PPO

## 2013-01-23 ENCOUNTER — Ambulatory Visit (INDEPENDENT_AMBULATORY_CARE_PROVIDER_SITE_OTHER): Payer: Medicare PPO

## 2013-01-23 DIAGNOSIS — Z23 Encounter for immunization: Secondary | ICD-10-CM

## 2013-01-24 ENCOUNTER — Ambulatory Visit: Payer: Medicare PPO

## 2013-02-08 ENCOUNTER — Ambulatory Visit (INDEPENDENT_AMBULATORY_CARE_PROVIDER_SITE_OTHER): Payer: Medicare PPO

## 2013-02-08 DIAGNOSIS — J309 Allergic rhinitis, unspecified: Secondary | ICD-10-CM

## 2013-03-06 ENCOUNTER — Ambulatory Visit (INDEPENDENT_AMBULATORY_CARE_PROVIDER_SITE_OTHER): Payer: Medicare PPO

## 2013-03-06 DIAGNOSIS — J309 Allergic rhinitis, unspecified: Secondary | ICD-10-CM

## 2013-03-08 ENCOUNTER — Ambulatory Visit: Payer: Medicare PPO

## 2013-05-02 ENCOUNTER — Ambulatory Visit (INDEPENDENT_AMBULATORY_CARE_PROVIDER_SITE_OTHER): Payer: Medicare PPO

## 2013-05-02 DIAGNOSIS — J309 Allergic rhinitis, unspecified: Secondary | ICD-10-CM

## 2013-05-26 ENCOUNTER — Other Ambulatory Visit (HOSPITAL_COMMUNITY): Payer: Self-pay | Admitting: Internal Medicine

## 2013-05-26 DIAGNOSIS — R011 Cardiac murmur, unspecified: Secondary | ICD-10-CM

## 2013-05-30 ENCOUNTER — Ambulatory Visit (INDEPENDENT_AMBULATORY_CARE_PROVIDER_SITE_OTHER): Payer: Medicare PPO

## 2013-05-30 DIAGNOSIS — J309 Allergic rhinitis, unspecified: Secondary | ICD-10-CM

## 2013-05-31 ENCOUNTER — Ambulatory Visit (HOSPITAL_COMMUNITY)
Admission: RE | Admit: 2013-05-31 | Discharge: 2013-05-31 | Disposition: A | Discharge status: Home / Self Care | Payer: Medicare PPO | Source: Ambulatory Visit | Attending: Cardiovascular Disease | Admitting: Cardiovascular Disease

## 2013-05-31 DIAGNOSIS — I059 Rheumatic mitral valve disease, unspecified: Secondary | ICD-10-CM | POA: Insufficient documentation

## 2013-05-31 DIAGNOSIS — R011 Cardiac murmur, unspecified: Secondary | ICD-10-CM

## 2013-05-31 DIAGNOSIS — I079 Rheumatic tricuspid valve disease, unspecified: Secondary | ICD-10-CM | POA: Insufficient documentation

## 2013-05-31 DIAGNOSIS — I369 Nonrheumatic tricuspid valve disorder, unspecified: Secondary | ICD-10-CM

## 2013-05-31 DIAGNOSIS — Z8673 Personal history of transient ischemic attack (TIA), and cerebral infarction without residual deficits: Secondary | ICD-10-CM | POA: Insufficient documentation

## 2013-05-31 NOTE — Progress Notes (Signed)
2D Echo Performed 05/31/2013    Aemilia Dedrick, RCS  

## 2013-06-29 ENCOUNTER — Ambulatory Visit (INDEPENDENT_AMBULATORY_CARE_PROVIDER_SITE_OTHER): Payer: Medicare PPO

## 2013-06-29 DIAGNOSIS — J309 Allergic rhinitis, unspecified: Secondary | ICD-10-CM

## 2013-07-21 ENCOUNTER — Encounter: Payer: Self-pay | Admitting: Internal Medicine

## 2013-07-31 ENCOUNTER — Ambulatory Visit (INDEPENDENT_AMBULATORY_CARE_PROVIDER_SITE_OTHER): Payer: Medicare PPO

## 2013-07-31 DIAGNOSIS — J309 Allergic rhinitis, unspecified: Secondary | ICD-10-CM

## 2013-08-31 ENCOUNTER — Ambulatory Visit (INDEPENDENT_AMBULATORY_CARE_PROVIDER_SITE_OTHER): Payer: Medicare PPO

## 2013-08-31 DIAGNOSIS — J309 Allergic rhinitis, unspecified: Secondary | ICD-10-CM

## 2013-09-26 ENCOUNTER — Ambulatory Visit (INDEPENDENT_AMBULATORY_CARE_PROVIDER_SITE_OTHER): Payer: Medicare PPO

## 2013-09-26 DIAGNOSIS — J309 Allergic rhinitis, unspecified: Secondary | ICD-10-CM

## 2013-10-30 ENCOUNTER — Ambulatory Visit (INDEPENDENT_AMBULATORY_CARE_PROVIDER_SITE_OTHER): Payer: Medicare PPO

## 2013-10-30 DIAGNOSIS — J309 Allergic rhinitis, unspecified: Secondary | ICD-10-CM

## 2013-11-21 ENCOUNTER — Ambulatory Visit: Payer: Medicare PPO

## 2013-11-23 ENCOUNTER — Ambulatory Visit (INDEPENDENT_AMBULATORY_CARE_PROVIDER_SITE_OTHER): Payer: Medicare PPO

## 2013-11-23 DIAGNOSIS — J309 Allergic rhinitis, unspecified: Secondary | ICD-10-CM

## 2013-12-21 ENCOUNTER — Ambulatory Visit (INDEPENDENT_AMBULATORY_CARE_PROVIDER_SITE_OTHER): Payer: Medicare PPO

## 2013-12-21 DIAGNOSIS — J309 Allergic rhinitis, unspecified: Secondary | ICD-10-CM

## 2013-12-27 ENCOUNTER — Ambulatory Visit (INDEPENDENT_AMBULATORY_CARE_PROVIDER_SITE_OTHER): Payer: Medicare PPO

## 2013-12-27 DIAGNOSIS — J309 Allergic rhinitis, unspecified: Secondary | ICD-10-CM

## 2014-01-16 ENCOUNTER — Ambulatory Visit (INDEPENDENT_AMBULATORY_CARE_PROVIDER_SITE_OTHER): Payer: Medicare PPO

## 2014-01-16 DIAGNOSIS — J309 Allergic rhinitis, unspecified: Secondary | ICD-10-CM

## 2014-01-16 DIAGNOSIS — Z23 Encounter for immunization: Secondary | ICD-10-CM

## 2014-03-02 ENCOUNTER — Encounter: Payer: Self-pay | Admitting: Internal Medicine

## 2014-03-12 ENCOUNTER — Ambulatory Visit (INDEPENDENT_AMBULATORY_CARE_PROVIDER_SITE_OTHER): Payer: Medicare PPO

## 2014-03-12 DIAGNOSIS — J309 Allergic rhinitis, unspecified: Secondary | ICD-10-CM

## 2014-04-25 ENCOUNTER — Ambulatory Visit (INDEPENDENT_AMBULATORY_CARE_PROVIDER_SITE_OTHER): Payer: Medicare PPO

## 2014-04-25 DIAGNOSIS — J309 Allergic rhinitis, unspecified: Secondary | ICD-10-CM

## 2014-05-15 ENCOUNTER — Ambulatory Visit (INDEPENDENT_AMBULATORY_CARE_PROVIDER_SITE_OTHER): Payer: Medicare PPO

## 2014-05-15 DIAGNOSIS — J309 Allergic rhinitis, unspecified: Secondary | ICD-10-CM

## 2014-06-14 ENCOUNTER — Ambulatory Visit: Payer: Medicare PPO

## 2014-07-05 ENCOUNTER — Ambulatory Visit (INDEPENDENT_AMBULATORY_CARE_PROVIDER_SITE_OTHER): Payer: Medicare PPO

## 2014-07-05 DIAGNOSIS — J309 Allergic rhinitis, unspecified: Secondary | ICD-10-CM

## 2014-07-27 DIAGNOSIS — B0229 Other postherpetic nervous system involvement: Secondary | ICD-10-CM

## 2014-07-27 HISTORY — DX: Other postherpetic nervous system involvement: B02.29

## 2014-09-04 ENCOUNTER — Encounter: Payer: Self-pay | Admitting: Diagnostic Neuroimaging

## 2014-09-04 ENCOUNTER — Telehealth: Payer: Self-pay | Admitting: Diagnostic Neuroimaging

## 2014-09-04 ENCOUNTER — Ambulatory Visit (INDEPENDENT_AMBULATORY_CARE_PROVIDER_SITE_OTHER): Payer: Medicare PPO | Admitting: Diagnostic Neuroimaging

## 2014-09-04 VITALS — BP 152/91 | HR 77 | Ht 68.0 in

## 2014-09-04 DIAGNOSIS — B0229 Other postherpetic nervous system involvement: Secondary | ICD-10-CM | POA: Diagnosis not present

## 2014-09-04 MED ORDER — GABAPENTIN 300 MG PO CAPS: 300.0000 mg | ORAL_CAPSULE | Freq: Three times a day (TID) | ORAL | Status: DC

## 2014-09-04 NOTE — Patient Instructions (Signed)
Start gabapentin 300mg  twice a day (morning and night) for 3 days. Then increase to three times per day.

## 2014-09-04 NOTE — Telephone Encounter (Signed)
I called back.  They wanted to know if patient should continue Valtrex.  Per OV note, instructions say continue this med course as prescribed by PCP. They verbalized understanding and will call back if anything further is needed.

## 2014-09-04 NOTE — Progress Notes (Signed)
[facility]  PATIENT: Blake Harrison DOB: 1925-12-13  REFERRING CLINICIAN: Lorin Harrison HISTORY FROM: patient and wife  REASON FOR VISIT: new consult    HISTORICAL  CHIEF COMPLAINT:  Chief Complaint  Patient presents with  . New Evaluation    herpes zoster infection    HISTORY OF PRESENT ILLNESS:   81 year old male with hypertension, prostate cancer, stroke, here for evaluation of postherpetic neuralgia. Patient is accompanied by his wife who is also his primary caregiver. Patient for consultation at the request of Dr. Nancy Harrison.   When I asked patient when symptoms began and he had a difficult time telling me. He answered "I don't know" to many of my questions. When I asked him today's date he replied "10/02/2014", when in fact today's date is 09/04/2014. When I corrected him, he was defensive and quickly told me that he "has all his faculties". When I asked him if he was having some memory problems he denied it. In the background his wife shook her head yes and also told me several episodes of confusion and memory loss. She states that he got lost driving to the dermatologist office a few weeks ago, and now patient is no longer driving. Also he stopped paying bills for his home in February. A few nights ago the electricity and gas were turned off by the utility companies due to lapse in payment of bills. Now patient's wife is getting help from other friends so that these bills can be paid on time. Patient's wife admitted to me that she is overwhelmed and is looking for help with additional services at home.  According to patient's wife and review of outside notes, symptoms began sometime in early to mid April 2016. He developed posterior, left-sided upper back burning, searing pain. Patient's wife noted that he had developed a red rash with blisters in this region. They went to his dermatologist who diagnosed shingles infection. He prescribed some medications and topical creams, but  patient and wife do not remember what these medications were. Also they did not bring these medications to this office visit. Apparently some other pain medicines were tried which did not help. Patient and wife are not sure what these medications were. PCP note mentions that gabapentin, tramadol, Tegretol and Lidoderm patches were tried. Patient and wife do not know what dose or duration these medications were tried. They referred me to call her pharmacy to find out more information.  Patient's wife thinks he was started on antiviral medication at the onset of rash. During the end of visit she was not sure. Dr. Nancy Harrison prescribed another course of valacyclovir on 08/31/14. Patient's wife repeatedly asking the same question over and over regarding medication management of existing medicines as well as new medications. Patient's wife and patient repeatedly asked me whether I would be sending in any new medication today.  When I reviewed medications that the patient and wife brought in today, I saw a bottle of hydrocodone/Tylenol, which patient was prescribed but did not try. He was concerned about potential constipation and therefore did not even try taking this medication. He has tried taking ibuprofen 200 mg 1 tablet one day last week without relief. He has not tried any further pain medication. Today he feels 10 out of 10 severe pain. Is asking for some type of nerve block to relieve pain. Marcaine and Depo-Medrol injection were given in the office of PCP on 08/31/14 without relief.    REVIEW OF SYSTEMS: Full 14 system review  of systems performed and notable only for only as per history of present illness.  ALLERGIES: No Known Allergies  HOME MEDICATIONS: Outpatient Prescriptions Prior to Visit  Medication Sig Dispense Refill  . amLODipine (NORVASC) 2.5 MG tablet Take 2.5 mg by mouth daily.      . Cholecalciferol (VITAMIN D3) 2000 UNITS TABS Take 1 tablet by mouth daily.      . citalopram (CELEXA) 20 MG  tablet Take 1 tablet by mouth daily.    . CRESTOR 20 MG tablet Take 1 tablet by mouth daily.    Marland Kitchen lisinopril-hydrochlorothiazide (PRINZIDE,ZESTORETIC) 20-12.5 MG per tablet Take 1 tablet by mouth daily.     . Multiple Vitamin (MULTIVITAMIN) tablet Take 1 tablet by mouth daily.      Marland Kitchen aspirin 325 MG tablet Take 325 mg by mouth daily.      Marland Kitchen EPINEPHrine (EPIPEN 2-PAK) 0.3 mg/0.3 mL DEVI Use as directed as needed for severe allergic reaction      No facility-administered medications prior to visit.    PAST MEDICAL HISTORY: Past Medical History  Diagnosis Date  . ALLERGIC RHINITIS   . Arthritis     knee  . Prostate cancer     seeds  . CVA (cerebral infarction) 2011    PAST SURGICAL HISTORY: Past Surgical History  Procedure Laterality Date  . Total knee arthroplasty      left    FAMILY HISTORY: Family History  Problem Relation Age of Onset  . Heart attack Father     SOCIAL HISTORY:  History   Social History  . Marital Status: Married    Spouse Name: Blake Harrison  . Number of Children: 2  . Years of Education: doctorate    Occupational History  . retired     Engineer, water   . 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 Smoking status: Never Smoker   . Smokeless tobacco: Not on file  . Alcohol Use: 0.0 oz/week    0 Standard drinks or equivalent per week     Comment: wine once in a while   . Drug Use: No  . Sexual Activity: Not on file   Other Topics Concern  . Not on file   Social History Narrative   Lives at home with wife    Drinks soda 1 a day     PHYSICAL EXAM  Filed Vitals:   09/04/14 1021  BP: 152/91  Pulse: 77  Height: 5' 8"  (1.727 m)    There is no weight on file to calculate BMI.  No exam data present  No flowsheet data found.  GENERAL EXAM: Patient is in no distress; well developed, nourished and groomed; neck is supple; LEFT UPPER THORACIC POSTERIOR RASH (RED, FEW  PUSTULES)  CARDIOVASCULAR: Regular rate and rhythm, no murmurs, no carotid bruits  NEUROLOGIC: MENTAL STATUS: awake, alert, oriented to person, place; "October 02, 2014", MEMORY IMPAIRED; [place]; language fluent, comprehension intact, naming intact, fund of knowledge appropriate CRANIAL NERVE: no papilledema on fundoscopic exam, pupils equal and reactive to light, visual fields full to confrontation, extraocular muscles intact, no nystagmus, facial sensation; DECR LEFT NL FOLD; hearing intact, palate elevates symmetrically, uvula midline, shoulder shrug symmetric, tongue midline. MOTOR: normal bulk and tone, full strength in the BUE, BLE; EXCEPT BILATERAL FOOT DROP SENSORY: DECR VIB AND PP IN FEET COORDINATION: finger-nose-finger, finefinger movements normal REFLEXES: BUE 1, BLE TRACE GAIT/STATION: IN WHEEL CHAIR; CANNOT STAND UNASSISTED  DIAGNOSTIC DATA (LABS, IMAGING, TESTING) - I reviewed patient records, labs, notes, testing and imaging myself where available.  Lab Results  Component Value Date   WBC 6.4 12/09/2010   HGB 12.6* 12/09/2010   HCT 37.6* 12/09/2010   MCV 94.3 12/09/2010   PLT 266 12/09/2010      Component Value Date/Time   NA 131* 12/09/2010 1246   K 4.7 12/09/2010 1246   CL 95* 12/09/2010 1246   CO2 27 12/09/2010 1246   GLUCOSE 95 12/09/2010 1246   BUN 26* 12/09/2010 1246   CREATININE 1.34 12/09/2010 1246   CREATININE 1.48* 10/14/2010 1532   CALCIUM 9.6 12/09/2010 1246   PROT 6.7 12/09/2010 1246   ALBUMIN 4.1 12/09/2010 1246   AST 19 12/09/2010 1246   ALT 14 12/09/2010 1246   ALKPHOS 54 12/09/2010 1246   BILITOT 1.0 12/09/2010 1246   GFRNONAA 41* 03/01/2010 0532   GFRAA * 03/01/2010 0532    49        The eGFR has been calculated using the MDRD equation. This calculation has not been validated in all clinical situations. eGFR's persistently <60 mL/min signify possible Chronic Kidney Disease.   Lab Results  Component  Value Date   CHOL  01/29/2010    171        ATP III CLASSIFICATION:  <200     mg/dL   Desirable  200-239  mg/dL   Borderline High  >=240    mg/dL   High          HDL 56 01/29/2010   LDLCALC * 01/29/2010    102        Total Cholesterol/HDL:CHD Risk Coronary Heart Disease Risk Table                     Men   Women  1/2 Average Risk   3.4   3.3  Average Risk       5.0   4.4  2 X Average Risk   9.6   7.1  3 X Average Risk  23.4   11.0        Use the calculated Patient Ratio above and the CHD Risk Table to determine the patient's CHD Risk.        ATP III CLASSIFICATION (LDL):  <100     mg/dL   Optimal  100-129  mg/dL   Near or Above                    Optimal  130-159  mg/dL   Borderline  160-189  mg/dL   High  >190     mg/dL   Very High   TRIG 67 01/29/2010   CHOLHDL 3.1 01/29/2010   Lab Results  Component Value Date   HGBA1C  01/28/2010    5.5 (NOTE)                                                                       According to the ADA Clinical Practice Recommendations for 2011, when HbA1c is used as a screening test:   >=6.5%   Diagnostic of Diabetes Mellitus           (if abnormal result  is confirmed)  5.7-6.4%  Increased risk of developing Diabetes Mellitus  References:Diagnosis and Classification of Diabetes Mellitus,Diabetes TKWI,0973,53(GDJME 1):S62-S69 and Standards of Medical Care in         Diabetes - 2011,Diabetes QAST,4196,22  (Suppl 1):S11-S61.   No results found for: VITAMINB12 No results found for: TSH   01/29/10 MRI brain - Acute non hemorrhagic infarct posterior limb right internal capsule. Moderate small vessel disease type changes. [I reviewed images myself and agree with interpretation. In addition there is moderate atrophy. -VRP]   01/29/10 MRA head - Intracranial atherosclerotic type changes predominately involving branch vessels. [I reviewed images myself and agree with interpretation. -VRP]     ASSESSMENT AND PLAN  81 y.o. year old male  here with:  Dx: post-herpetic neuralgia (left upper thoracic region)  PLAN: - continue valacyclovir course as per PCP - try hydrocondone/APAP (rx'd but patient has not taken it yet) - trial of gabapentin 313m BID --> TID; may titrate up further over next 2-4 weeks - patient's wife overwhelmed at home; also suspect patient may be developing memory problem / dementia; will evaluate further at next visit; advised her to discuss with PCP about home health agency services  Meds ordered this encounter  Medications  . gabapentin (NEURONTIN) 300 MG capsule    Sig: Take 1 capsule (300 mg total) by mouth 3 (three) times daily.    Dispense:  90 capsule    Refill:  3   Return in about 1 month (around 10/05/2014).  I spent 45 minutes of face to face time with patient. Greater than 50% of time was spent in counseling and coordination of care with patient.    VPenni Bombard MD 52/[id number]/9892 111:94AM Certified in Neurology, Neurophysiology and Neuroimaging  [facility] [address] [place] [phone]

## 2014-09-04 NOTE — Telephone Encounter (Signed)
[name], pt's wife called wanting to clarify that the patient can take GABAPENTIN 300mg  along with valACYclovir (VALTREX) 1000 MG tablet. Please call and advise. [name] can be reached @ [phone]

## 2014-09-14 ENCOUNTER — Telehealth: Payer: Self-pay | Admitting: Diagnostic Neuroimaging

## 2014-09-14 NOTE — Telephone Encounter (Signed)
The patient caregiver is calling to request a stronger prescription for her husband, as the patient is in great pain, and the patient cannot wait until the next appointment with Dr. Leta Baptist. Please advice and call back caregiver at [phone].

## 2014-09-14 NOTE — Telephone Encounter (Signed)
Spoke to the wife for over 10 minutes about the medications. She was over and over about the valcyclovir, the gabapentin and the pt needing something else for pain. She was obviously confused about what the medications were for. She asked multiple times about the valcyclovir and if it was for pain; i explained that it was for his shingles. I also asked her about the gabapentin and she told me that she was giving it to her husband 3x a day as written. It took about 5 minutes of explaining about the hydrocodone before she told me that she found that bottle unopened. I told her that it was for pain and to give it to give every 6 hours as needed for pain. She asked if he was to get it 6 times a day. i repeated myself multiple times that the pt was to get the medication AS NEEDED no more than 4 times a day per the prescription. She stated that she finally understood and thanked me for calling. I talked with Dr. Leta Baptist and I will forward this note to Dr. Nancy Fetter and Dr. Leta Baptist. Pt and his wife seem to be in an unsafe situation right now where they are unable to care for themselves or one another.

## 2014-09-20 NOTE — Telephone Encounter (Signed)
Spoke to the pts wife and she was asking about the hydrocodone. I told her that she would need to talk to Dr. Nancy Fetter about that medication since he wrote the prescription. I also told her that Dr. Leta Baptist wanted the pt to go up to 2 capsules of the gabapentin three times a day and see how he feels after two weeks. She wrote it down and told me that she would try it. She thanked me

## 2014-09-20 NOTE — Telephone Encounter (Signed)
Patient's wife is calling back stating the patient needs a stronger medication for pain than HYDROcodone-acetaminophen (NORCO/VICODIN) 5-325 MG per tablet. Please call patient's wife to discuss. Thank you.

## 2014-09-26 NOTE — Telephone Encounter (Signed)
Patient called stating the gabapentin (NEURONTIN) 300 MG capsule is not helping with the pain. He is taking 1 capsule 3xday( note from 5/26 says should be 2 capsules 3xday). He states it is a searing pain that going thru his back.  Please call and advise. Patient can be reached at [phone].

## 2014-09-27 ENCOUNTER — Telehealth: Payer: Self-pay | Admitting: Diagnostic Neuroimaging

## 2014-09-27 NOTE — Telephone Encounter (Signed)
Pt called and requested to speak with nurse regarding his medication Rx. gabapentin (NEURONTIN) 300 MG capsule. He would like to know if he can get a stronger dose of the medication. He is experiencing a lot of pain because of his shingles. Please call and advise.

## 2014-09-27 NOTE — Telephone Encounter (Signed)
Told the pt himself on the phone that he needed to be taking two capsules of gabapentin three times a day. I informed him that when his wife called on 09/20/14 that I told her the same thing. He stated an understanding. i told him to try taking 2 capsules three times a day for about 2 weeks and see how that felt with the pain. He stated an understanding

## 2014-09-28 ENCOUNTER — Telehealth: Payer: Self-pay | Admitting: Neurology

## 2014-09-28 NOTE — Telephone Encounter (Signed)
Received message from answering service that Mr. Blake Harrison was having more shingles pain and increase his medication to 2 pills 3 times a day and would like a call back. I called and left a message at [phone]   Called again 445 pm and left message (Phyllis's voicemail) to call answering service back if need to speak to MD right away, otherwise I would leave message for them to be called back Monday

## 2014-10-02 ENCOUNTER — Encounter: Payer: Self-pay | Admitting: Diagnostic Neuroimaging

## 2014-10-02 ENCOUNTER — Ambulatory Visit (INDEPENDENT_AMBULATORY_CARE_PROVIDER_SITE_OTHER): Payer: Medicare PPO | Admitting: Diagnostic Neuroimaging

## 2014-10-02 VITALS — BP 157/87 | HR 71 | Ht 68.0 in

## 2014-10-02 DIAGNOSIS — G609 Hereditary and idiopathic neuropathy, unspecified: Secondary | ICD-10-CM

## 2014-10-02 DIAGNOSIS — B0229 Other postherpetic nervous system involvement: Secondary | ICD-10-CM | POA: Diagnosis not present

## 2014-10-02 DIAGNOSIS — F0390 Unspecified dementia without behavioral disturbance: Secondary | ICD-10-CM | POA: Diagnosis not present

## 2014-10-02 MED ORDER — GABAPENTIN 300 MG PO CAPS: 900.0000 mg | ORAL_CAPSULE | Freq: Three times a day (TID) | ORAL | Status: DC

## 2014-10-02 NOTE — Progress Notes (Signed)
[facility]  PATIENT: Blake Harrison DOB: Jun 06, 1925  REFERRING CLINICIAN: Lorin Glass HISTORY FROM: patient and wife  REASON FOR VISIT: follow up   HISTORICAL  CHIEF COMPLAINT:  Chief Complaint  Patient presents with  . Follow-up    hopt-herpatic nerualgia     HISTORY OF PRESENT ILLNESS:   UPDATE 10/02/14: Since last visit, pain is slightly better. Memory, gait, weakness still poor. Poor insight on part of patient and wife. Patient and wife keep asking same questions over and over. In terms of functional decline, he has been using cane x 3 years. He has had gradual loss of balance and strength since last 6-12 months. Wife had to take over writing checks and finances in last 6 months. Patient no longer driving. Patient now only taking gabapentin. He stopped all other meds on his own. Wife not supervising his meds.   PRIOR HPI (09/04/14): 81 year old male with hypertension, prostate cancer, stroke, here for evaluation of postherpetic neuralgia. Patient is accompanied by his wife who is also his primary caregiver. Patient for consultation at the request of Dr. Nancy Fetter. When I asked patient when symptoms began and he had a difficult time telling me. He answered "I don't know" to many of my questions. When I asked him today's date he replied "10/02/2014", when in fact today's date is 09/04/2014. When I corrected him, he was defensive and quickly told me that he "has all his faculties". When I asked him if he was having some memory problems he denied it. In the background his wife shook her head yes and also told me several episodes of confusion and memory loss. She states that he got lost driving to the dermatologist office a few weeks ago, and now patient is no longer driving. Also he stopped paying bills for his home in February. A few nights ago the electricity and gas were turned off by the utility companies due to lapse in payment of bills. Now patient's wife is getting help from other  friends so that these bills can be paid on time. Patient's wife admitted to me that she is overwhelmed and is looking for help with additional services at home. According to patient's wife and review of outside notes, symptoms began sometime in early to mid April 2016. He developed posterior, left-sided upper back burning, searing pain. Patient's wife noted that he had developed a red rash with blisters in this region. They went to his dermatologist who diagnosed shingles infection. He prescribed some medications and topical creams, but patient and wife do not remember what these medications were. Also they did not bring these medications to this office visit. Apparently some other pain medicines were tried which did not help. Patient and wife are not sure what these medications were. PCP note mentions that gabapentin, tramadol, Tegretol and Lidoderm patches were tried. Patient and wife do not know what dose or duration these medications were tried. They referred me to call her pharmacy to find out more information. Patient's wife thinks he was started on antiviral medication at the onset of rash. During the end of visit she was not sure. Dr. Nancy Fetter prescribed another course of valacyclovir on 08/31/14. Patient's wife repeatedly asking the same question over and over regarding medication management of existing medicines as well as new medications. Patient's wife and patient repeatedly asked me whether I would be sending in any new medication today. When I reviewed medications that the patient and wife brought in today, I saw a bottle of hydrocodone/Tylenol,  which patient was prescribed but did not try. He was concerned about potential constipation and therefore did not even try taking this medication. He has tried taking ibuprofen 200 mg 1 tablet one day last week without relief. He has not tried any further pain medication. Today he feels 10 out of 10 severe pain. Is asking for some type of nerve block to relieve pain.  Marcaine and Depo-Medrol injection were given in the office of PCP on 08/31/14 without relief.    REVIEW OF SYSTEMS: Full 14 system review of systems performed and notable only for only as per history of present illness.  ALLERGIES: No Known Allergies  HOME MEDICATIONS: Outpatient Prescriptions Prior to Visit  Medication Sig Dispense Refill  . amLODipine (NORVASC) 2.5 MG tablet Take 2.5 mg by mouth daily.      Marland Kitchen aspirin 81 MG tablet Take 81 mg by mouth daily.    . Cholecalciferol (VITAMIN D3) 2000 UNITS TABS Take 1 tablet by mouth daily.      . citalopram (CELEXA) 20 MG tablet Take 1 tablet by mouth daily.    . CRESTOR 20 MG tablet Take 1 tablet by mouth daily.    Marland Kitchen gabapentin (NEURONTIN) 300 MG capsule Take 1 capsule (300 mg total) by mouth 3 (three) times daily. (Patient taking differently: Take 600 mg by mouth 3 (three) times daily. ) 90 capsule 3  . HYDROcodone-acetaminophen (NORCO/VICODIN) 5-325 MG per tablet Take 1 tablet by mouth every 6 (six) hours as needed for moderate pain.    Marland Kitchen lidocaine (LIDODERM) 5 % Place 1 patch onto the skin daily. Remove & Discard patch within 12 hours or as directed by MD    . lisinopril-hydrochlorothiazide (PRINZIDE,ZESTORETIC) 20-12.5 MG per tablet Take 1 tablet by mouth daily.     . Meth-Hyo-M Bl-Na Phos-Ph Sal (URIBEL PO) Take 1 capsule by mouth daily.    . Multiple Vitamin (MULTIVITAMIN) tablet Take 1 tablet by mouth daily.      . promethazine (PHENERGAN) 12.5 MG tablet Take 12.5 mg by mouth every 6 (six) hours as needed for nausea or vomiting.    . valACYclovir (VALTREX) 1000 MG tablet Take 1,000 mg by mouth 2 (two) times daily.     No facility-administered medications prior to visit.    PAST MEDICAL HISTORY: Past Medical History  Diagnosis Date  . ALLERGIC RHINITIS   . Arthritis     knee  . Prostate cancer     seeds  . CVA (cerebral infarction) 2011    PAST SURGICAL HISTORY: Past Surgical History  Procedure Laterality Date  . Total  knee arthroplasty      left    FAMILY HISTORY: Family History  Problem Relation Age of Onset  . Heart attack Father     SOCIAL HISTORY:  History   Social History  . Marital Status: Married    Spouse Name: [name]  . Number of Children: 2  . Years of Education: doctorate    Occupational History  . retired     Engineer, water   . 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 Smoking status: Never Smoker   . Smokeless tobacco: Not on file  . Alcohol Use: 0.0 oz/week    0 Standard drinks or equivalent per week     Comment: wine once in a while   . Drug Use: No  . Sexual Activity: Not on file   Other Topics Concern  . Not on file  Social History Narrative   Lives at home with wife    Drinks soda 1 a day     PHYSICAL EXAM  Filed Vitals:   10/02/14 1312  BP: 157/87  Pulse: 71  Height: _0  (1.727 m)    There is no weight on file to calculate BMI.  No exam data present  MMSE - Mini Mental State Exam 10/02/2014  Orientation to time 3  Orientation to Place 5  Registration 3  Attention/ Calculation 5  Recall 2  Language- name 2 objects 2  Language- repeat 0  Language- follow 3 step command 0  Language- read & follow direction 1  Write a sentence 1  Copy design 1  Total score 23    GENERAL EXAM: Patient is in no distress; well developed, nourished and groomed; neck is supple  CARDIOVASCULAR: Regular rate and rhythm, no murmurs, no carotid bruits  NEUROLOGIC: MENTAL STATUS: awake, alert; PERSEVERATES, REPEATS HIMSELF; language fluent, comprehension intact, naming intact, fund of knowledge appropriate CRANIAL NERVE: no papilledema on fundoscopic exam, pupils equal and reactive to light, visual fields full to confrontation, extraocular muscles intact, no nystagmus, facial sensation; DECR LEFT NL FOLD; hearing intact, palate elevates symmetrically, uvula midline, shoulder shrug symmetric, tongue  midline. MOTOR: normal bulk and tone, full strength in the BUE, BLE; EXCEPT ATROPHY AND WEAKNESS OF BILATERAL INTRINSIC HAND MUSCLES; BILATERAL FOOT DROP SENSORY: DECR VIB AND PP IN FEET COORDINATION: finger-nose-finger, finefinger movements normal REFLEXES: BUE 1, BLE TRACE GAIT/STATION: IN WHEEL CHAIR; CANNOT STAND UNASSISTED    DIAGNOSTIC DATA (LABS, IMAGING, TESTING) - I reviewed patient records, labs, notes, testing and imaging myself where available.  Lab Results  Component Value Date   WBC 6.4 12/09/2010   HGB 12.6* 12/09/2010   HCT 37.6* 12/09/2010   MCV 94.3 12/09/2010   PLT 266 12/09/2010      Component Value Date/Time   NA 131* 12/09/2010 1246   K 4.7 12/09/2010 1246   CL 95* 12/09/2010 1246   CO2 27 12/09/2010 1246   GLUCOSE 95 12/09/2010 1246   BUN 26* 12/09/2010 1246   CREATININE 1.34 12/09/2010 1246   CREATININE 1.48* 10/14/2010 1532   CALCIUM 9.6 12/09/2010 1246   PROT 6.7 12/09/2010 1246   ALBUMIN 4.1 12/09/2010 1246   AST 19 12/09/2010 1246   ALT 14 12/09/2010 1246   ALKPHOS 54 12/09/2010 1246   BILITOT 1.0 12/09/2010 1246   GFRNONAA 41* 03/01/2010 0532   GFRAA * 03/01/2010 0532    49        The eGFR has been calculated using the MDRD equation. This calculation has not been validated in all clinical situations. eGFR's persistently <60 mL/min signify possible Chronic Kidney Disease.   Lab Results  Component Value Date   CHOL  01/29/2010    171        ATP III CLASSIFICATION:  <200     mg/dL   Desirable  200-239  mg/dL   Borderline High  >=240    mg/dL   High          HDL 56 01/29/2010   LDLCALC * 01/29/2010    102        Total Cholesterol/HDL:CHD Risk Coronary Heart Disease Risk Table                     Men   Women  1/2 Average Risk   3.4   3.3  Average Risk       5.0  4.4  2 X Average Risk   9.6   7.1  3 X Average Risk  23.4   11.0        Use the calculated Patient Ratio above and the CHD Risk Table to determine the patient's  CHD Risk.        ATP III CLASSIFICATION (LDL):  <100     mg/dL   Optimal  100-129  mg/dL   Near or Above                    Optimal  130-159  mg/dL   Borderline  160-189  mg/dL   High  >190     mg/dL   Very High   TRIG 67 01/29/2010   CHOLHDL 3.1 01/29/2010   Lab Results  Component Value Date   HGBA1C  01/28/2010    5.5 (NOTE)                                                                       According to the ADA Clinical Practice Recommendations for 2011, when HbA1c is used as a screening test:   >=6.5%   Diagnostic of Diabetes Mellitus           (if abnormal result  is confirmed)  5.7-6.4%   Increased risk of developing Diabetes Mellitus  References:Diagnosis and Classification of Diabetes Mellitus,Diabetes VWPV,9480,16(PVVZS 1):S62-S69 and Standards of Medical Care in         Diabetes - 2011,Diabetes Care,2011,34  (Suppl 1):S11-S61.   No results found for: VITAMINB12 No results found for: TSH   01/29/10 MRI brain - Acute non hemorrhagic infarct posterior limb right internal capsule. Moderate small vessel disease type changes. [I reviewed images myself and agree with interpretation. In addition there is moderate atrophy. -VRP]   01/29/10 MRA head - Intracranial atherosclerotic type changes predominately involving branch vessels. [I reviewed images myself and agree with interpretation. -VRP]     ASSESSMENT AND PLAN  81 y.o. year old male here with:  Dx: post-herpetic neuralgia (left upper thoracic region) + dementia + neuropathy (? 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 gabapentin (NEURONTIN) 300 MG capsule    Sig: Take 3 capsules (900 mg total) by mouth 3 (three) times  daily.    Dispense:  270 capsule    Refill:  6   Orders Placed This Encounter  Procedures  . Neuropathy Panel  . [facility]  Return in about 3 months (around 01/02/2015).  I spent 45 minutes of face to face time with patient. Greater than 50% of time was spent in counseling and coordination of care with patient.    VPenni Bombard MD 68/05/7076 26:75PM Certified in Neurology, Neurophysiology and Neuroimaging  [facility] [address] [place] [phone]

## 2014-10-03 ENCOUNTER — Telehealth: Payer: Self-pay | Admitting: Diagnostic Neuroimaging

## 2014-10-03 NOTE — Telephone Encounter (Signed)
Patient's wife is calling regarding the patient who was seen in our office yesterday.  Patient's wife is following up on a compound neuropathy cream that was to be called to [facility].

## 2014-10-03 NOTE — Telephone Encounter (Signed)
Typically the compounded meds are sent to [facility].  I called back.  Spoke with Ms Pilz.  She is aware.  They will call us back if anything further is needed.

## 2014-10-04 LAB — NEUROPATHY PANEL
A/G Ratio: 1.1 (ref 0.7–2.0)
ALPHA 2: 0.8 g/dL (ref 0.4–1.2)
ANGIO CONVERT ENZYME: 26 U/L (ref 14–82)
Albumin ELP: 3.3 g/dL (ref 3.2–5.6)
Alpha 1: 0.3 g/dL (ref 0.1–0.4)
Anti Nuclear Antibody(ANA): NEGATIVE
BETA: 1 g/dL (ref 0.6–1.3)
GLOBULIN, TOTAL: 3.1 g/dL (ref 2.0–4.5)
Gamma Globulin: 1 g/dL (ref 0.5–1.6)
M-Spike, %: 0.2 g/dL — ABNORMAL HIGH
Rhuematoid fact SerPl-aCnc: 7 IU/mL (ref 0.0–13.9)
Sed Rate: 17 mm/hr (ref 0–30)
TSH: 1.75 u[IU]/mL (ref 0.450–4.500)
Total Protein: 6.4 g/dL (ref 6.0–8.5)
Vit D, 25-Hydroxy: 42.3 ng/mL (ref 30.0–100.0)
Vitamin B-12: 199 pg/mL — ABNORMAL LOW (ref 211–946)

## 2014-10-08 ENCOUNTER — Telehealth: Payer: Self-pay | Admitting: Diagnostic Neuroimaging

## 2014-10-08 NOTE — Telephone Encounter (Signed)
Patient's wife is calling and unaware that her husband's appointment for 6/15 had been cancelled and rescheduled.  She wanted to know if the appointment could be by phone in that it is very difficult to bring her husband in.  She would not discuss her concerns but just stated "whatever the doctor was going to talk with them about."  Could you please call her. Thanks!

## 2014-10-08 NOTE — Telephone Encounter (Signed)
error 

## 2014-10-08 NOTE — Telephone Encounter (Signed)
Called this number multiple times with a busy signal. IF the wife calls back, tell her that the appt on 10/10/14 was cancelled because she requested a sooner appt due to his pain. The pt was seen 10/02/14 and does not need to be seen again in a week. Dr. Leta Baptist will NOT do a phone visit. The pt has a follow up appt scheduled 01/08/15 and that is a 3 month follow up as directed from Dr. Mamie Nick himself. The pt does not need to be seen every month unless he is having issues. Please let her know this information and ask that she tells the pt this as well thank you

## 2014-10-09 ENCOUNTER — Telehealth: Payer: Self-pay | Admitting: Diagnostic Neuroimaging

## 2014-10-09 NOTE — Telephone Encounter (Signed)
Spoke to the pts wife and informed her that we do not do telephone visits. Also explained to her that was not a service we offered at this time. Informed her of the next appt in September and she stated a thanks. Also asked me to tell Dr. Leta Baptist that she is pleased with him as a provider and wants to thank him for the referral to home health.

## 2014-10-09 NOTE — Telephone Encounter (Signed)
Patients wife called and requested to speak with the nurse regarding the patients appt. Tomorrow. She would like to know if they can have a phone visit instead of coming in.Please call and advise.

## 2014-10-10 ENCOUNTER — Other Ambulatory Visit: Payer: Self-pay | Admitting: *Deleted

## 2014-10-10 ENCOUNTER — Telehealth: Payer: Self-pay | Admitting: *Deleted

## 2014-10-10 ENCOUNTER — Ambulatory Visit: Payer: Medicare PPO | Admitting: Diagnostic Neuroimaging

## 2014-10-10 DIAGNOSIS — B0229 Other postherpetic nervous system involvement: Secondary | ICD-10-CM

## 2014-10-10 DIAGNOSIS — E538 Deficiency of other specified B group vitamins: Secondary | ICD-10-CM

## 2014-10-10 MED ORDER — CYANOCOBALAMIN 1000 MCG/ML IJ SOLN: 1000.0000 ug | INTRAMUSCULAR | Status: DC

## 2014-10-10 NOTE — Telephone Encounter (Signed)
Dr. Leta Baptist wants to start the pt on Vit B12 injections, asked me to set this up with the home health agency. I called Kim at [facility] and she asked that I send the order in to the office and she would get it sent to the branch office. She told me that if she had any issues with getting it completed, then they would call me back. i thanked her

## 2014-10-15 ENCOUNTER — Telehealth: Payer: Self-pay | Admitting: Diagnostic Neuroimaging

## 2014-10-15 NOTE — Telephone Encounter (Signed)
Mellissa with [facility] called on behalf of the patient. The patient is requesting that he try Rx. LYRICA instead of his Rx. GABAPENTIN. He states that GABAPENTIN is not helpful and he has not been taking it. Please call and advise. [phone]).

## 2014-10-19 ENCOUNTER — Emergency Department (HOSPITAL_COMMUNITY): Payer: Medicare PPO

## 2014-10-19 ENCOUNTER — Encounter (HOSPITAL_COMMUNITY): Payer: Self-pay | Admitting: Emergency Medicine

## 2014-10-19 ENCOUNTER — Emergency Department (HOSPITAL_COMMUNITY)
Admission: EM | Admit: 2014-10-19 | Discharge: 2014-10-19 | Disposition: A | Discharge status: Home / Self Care | Payer: Medicare PPO | Attending: Emergency Medicine | Admitting: Emergency Medicine

## 2014-10-19 DIAGNOSIS — R1110 Vomiting, unspecified: Secondary | ICD-10-CM | POA: Insufficient documentation

## 2014-10-19 DIAGNOSIS — Y939 Activity, unspecified: Secondary | ICD-10-CM | POA: Insufficient documentation

## 2014-10-19 DIAGNOSIS — W1839XA Other fall on same level, initial encounter: Secondary | ICD-10-CM | POA: Diagnosis not present

## 2014-10-19 DIAGNOSIS — Y929 Unspecified place or not applicable: Secondary | ICD-10-CM | POA: Insufficient documentation

## 2014-10-19 DIAGNOSIS — R531 Weakness: Secondary | ICD-10-CM | POA: Diagnosis not present

## 2014-10-19 DIAGNOSIS — Z79899 Other long term (current) drug therapy: Secondary | ICD-10-CM | POA: Diagnosis not present

## 2014-10-19 DIAGNOSIS — S59912A Unspecified injury of left forearm, initial encounter: Secondary | ICD-10-CM | POA: Diagnosis present

## 2014-10-19 DIAGNOSIS — Z7982 Long term (current) use of aspirin: Secondary | ICD-10-CM | POA: Diagnosis not present

## 2014-10-19 DIAGNOSIS — Z8673 Personal history of transient ischemic attack (TIA), and cerebral infarction without residual deficits: Secondary | ICD-10-CM | POA: Diagnosis not present

## 2014-10-19 DIAGNOSIS — Y999 Unspecified external cause status: Secondary | ICD-10-CM | POA: Diagnosis not present

## 2014-10-19 DIAGNOSIS — Z8546 Personal history of malignant neoplasm of prostate: Secondary | ICD-10-CM | POA: Diagnosis not present

## 2014-10-19 DIAGNOSIS — M1990 Unspecified osteoarthritis, unspecified site: Secondary | ICD-10-CM | POA: Diagnosis not present

## 2014-10-19 DIAGNOSIS — S51812A Laceration without foreign body of left forearm, initial encounter: Secondary | ICD-10-CM | POA: Insufficient documentation

## 2014-10-19 DIAGNOSIS — W19XXXA Unspecified fall, initial encounter: Secondary | ICD-10-CM

## 2014-10-19 LAB — BASIC METABOLIC PANEL
ANION GAP: 11 (ref 5–15)
BUN: 24 mg/dL — ABNORMAL HIGH (ref 6–20)
CALCIUM: 9.3 mg/dL (ref 8.9–10.3)
CO2: 24 mmol/L (ref 22–32)
Chloride: 101 mmol/L (ref 101–111)
Creatinine, Ser: 1.41 mg/dL — ABNORMAL HIGH (ref 0.61–1.24)
GFR calc Af Amer: 50 mL/min — ABNORMAL LOW (ref >60)
GFR calc non Af Amer: 43 mL/min — ABNORMAL LOW (ref >60)
GLUCOSE: 132 mg/dL — AB (ref 65–99)
Potassium: 4 mmol/L (ref 3.5–5.1)
Sodium: 136 mmol/L (ref 135–145)

## 2014-10-19 LAB — I-STAT TROPONIN, ED: Troponin i, poc: 0.03 ng/mL (ref 0.00–0.08)

## 2014-10-19 LAB — CBC
HCT: 39.6 % (ref 39.0–52.0)
HEMOGLOBIN: 13.5 g/dL (ref 13.0–17.0)
MCH: 31.8 pg (ref 26.0–34.0)
MCHC: 34.1 g/dL (ref 30.0–36.0)
MCV: 93.4 fL (ref 78.0–100.0)
Platelets: 306 10*3/uL (ref 150–400)
RBC: 4.24 MIL/uL (ref 4.22–5.81)
RDW: 13.3 % (ref 11.5–15.5)
WBC: 12.1 10*3/uL — AB (ref 4.0–10.5)

## 2014-10-19 LAB — LIPASE, BLOOD: Lipase: 26 U/L (ref 22–51)

## 2014-10-19 LAB — I-STAT CG4 LACTIC ACID, ED: Lactic Acid, Venous: 1.05 mmol/L (ref 0.5–2.0)

## 2014-10-19 NOTE — ED Notes (Signed)
Patient transported to X-ray 

## 2014-10-19 NOTE — ED Notes (Signed)
Bed: AP70 Expected date:  Expected time:  Means of arrival:  Comments: Hold for neg pressure

## 2014-10-19 NOTE — Discharge Instructions (Signed)

## 2014-10-19 NOTE — Progress Notes (Deleted)
CSW attempted to speak with patient at bedside. However, patient was being transported to x-ray.   CSW will attempt to check on patient again later.  Willette Brace [phone] ED CSW 10/19/2014 9:23 PM

## 2014-10-19 NOTE — Progress Notes (Signed)
CSW spoke with nurse CM about patient.   Patient will have 24 hour comfort care starting tomorrow. Also, patient will have home health provided by [facility]. Nurse CM states that physician has added a Education officer, museum. CSW made wife aware.  Wife informed CSW that the pt has an abrasion on his L wrist. CSW made nurse aware.  CSW spoke with nurse who states that she will call [facility] to transport patient home.  Willette Brace [phone] ED CSW 10/19/2014 10:19 PM

## 2014-10-19 NOTE — ED Notes (Signed)
Per EMS-shingles on left side and back (2 weeks into treatment), c/o fall d/t weakness. Skin tear on left forearm and left ear. Possibly hit his head on the left side. Emesis x2 today. 20 G PIV in LFA. Has been given 8 mg Zofran total IV. Denies pain. Wife reports "diet has not been good." Has had increased weakness over the past two days. Not been able to walk as well as baseline. BP 140/70 HR 70 RR 16 CBG 142. No orthostatic changes.

## 2014-10-19 NOTE — Care Management Note (Addendum)
Case Management Note  Patient Details  Name: Blake Harrison MRN: [id number] Date of Birth: Jul 03, 1925  Subjective/Objective:     Patient presents to Ed with falls at home               Action/Plan: Discussed home health services with patient and patient's wife.   Expected Discharge Date:   10/19/2014               Expected Discharge Plan:  [place]  In-House Referral:  Clinical Social Work  Discharge planning Services  CM Consult  Post Acute Care Choice:  Home Health Choice offered to:  Spouse  DME Arranged:    DME Agency:     HH Arranged:  RN, PT, OT, Nurse's Aide (social work) [place]:  Research scientist (physical sciences)  Status of Service:  Completed, signed off  Medicare Important Message Given:    Date Medicare IM Given:    Medicare IM give by:    Date Additional Medicare IM Given:    Additional Medicare Important Message give by:     If discussed at [place] of Stay Meetings, dates discussed:    Additional Comments:  EDCM spoke to patient and his wife at bedside.  Patient lives at home with his wife.  Per patient's wife, patient has home healtj services with [facility] , however she does not know what services he has.  Patient's wife reports patient will be having 24/7 care with comfort Care starting tomorrow.  Patient's wife would like patient to stay overnight in the hospital.  [facility] informed patient's wife that this will not be possible as patient was found to not have a reason for medical admission at this time.  Patient's wife became agitated and stated, "How will get him home?  What am I going to do with him tonight?  EDCM informed patient's wife that we will come up with a plan so that the patient has everything he needs at home.  EDCM will be happy to arrange for increased services by [facility] and whatever dme needs the patient may have.  Patient's wife stated, "I want to speak to a social worker NOW!"  [place] immediately called social worker to come to speak  to patient's wife.  After social worker spoke to patient's wife, EDCM went to speak to her again regarding homehealth services and dme.  Patient's wife agreeable to RN, PT, OT aide and social worker from [facility] and [facility] referral. [facility] explained that social worker will be able to place patient from the community if needed. Patient's wife reports patient has a wheelchair, urinal and bsc at home.  Patient's wife refused hospital bed at home.  Patient's wife reports they have two king size mattresses at home placed together.  Patient listed as having Dr. Nancy Fetter as pcp.  Patient's wife thankful for services.  Patient to be discharged home, transported by ambulance.  Discussed patient with EDP.  No further EDCM needs at this time.  Christen Bame, Debbe Crumble, RN 10/19/2014, 10:02 PM

## 2014-10-19 NOTE — ED Notes (Signed)
Bed: JN42 Expected date:  Expected time:  Means of arrival:  Comments: EMS?elderly/n/v

## 2014-10-19 NOTE — ED Provider Notes (Signed)
CSN: [id number]     Arrival date & time 10/19/14  1650 History   First MD Initiated Contact with Patient 10/19/14 1702     Chief Complaint  Patient presents with  . Weakness  . Fall     (Consider location/radiation/quality/duration/timing/severity/associated sxs/prior Treatment) Patient is a 81 y.o. male presenting with weakness and fall. The history is provided by the patient.  Weakness This is a new problem. The current episode started 2 days ago. The problem occurs constantly. The problem has been gradually worsening. Pertinent negatives include no chest pain, no abdominal pain and no shortness of breath. Nothing aggravates the symptoms. Nothing relieves the symptoms.  Fall Pertinent negatives include no chest pain, no abdominal pain and no shortness of breath.    Past Medical History  Diagnosis Date  . ALLERGIC RHINITIS   . Arthritis     knee  . Prostate cancer     seeds  . CVA (cerebral infarction) 2011   Past Surgical History  Procedure Laterality Date  . Total knee arthroplasty      left   Family History  Problem Relation Age of Onset  . Heart attack Father    History  Substance Use Topics  . Smoking status: Never Smoker   . Smokeless tobacco: Not on file  . Alcohol Use: 0.0 oz/week    0 Standard drinks or equivalent per week     Comment: wine once in a while     Review of Systems  Constitutional: Negative for fever.  Respiratory: Negative for cough and shortness of breath.   Cardiovascular: Negative for chest pain.  Gastrointestinal: Positive for vomiting (twice today). Negative for abdominal pain.  Neurological: Positive for weakness.  All other systems reviewed and are negative.     Allergies  Review of patient's allergies indicates no known allergies.  Home Medications   Prior to Admission medications   Medication Sig Start Date End Date Taking? Authorizing Provider  amLODipine (NORVASC) 2.5 MG tablet Take 2.5 mg by mouth daily.       Historical Provider, MD  aspirin 81 MG tablet Take 81 mg by mouth daily.    Historical Provider, MD  cephALEXin (KEFLEX) 500 MG capsule  09/26/14   Historical Provider, MD  Cholecalciferol (VITAMIN D3) 2000 UNITS TABS Take 1 tablet by mouth daily.      Historical Provider, MD  citalopram (CELEXA) 20 MG tablet Take 1 tablet by mouth daily. 06/16/12   Historical Provider, MD  CRESTOR 20 MG tablet Take 1 tablet by mouth daily. 06/13/12   Historical Provider, MD  cyanocobalamin (,VITAMIN B-12,) 1000 MCG/ML injection Inject 1 mL (1,000 mcg total) into the skin every 30 (thirty) days. Instructions: to be given subq once a week for 4 weeks and then once a month for 6 months. 10/10/14   Penni Bombard, MD  gabapentin (NEURONTIN) 300 MG capsule Take 3 capsules (900 mg total) by mouth 3 (three) times daily. 10/02/14   Penni Bombard, MD  HYDROcodone-acetaminophen (NORCO/VICODIN) 5-325 MG per tablet Take 1 tablet by mouth every 6 (six) hours as needed for moderate pain.    Historical Provider, MD  hydrocortisone 2.5 % cream  09/15/14   Historical Provider, MD  lidocaine (LIDODERM) 5 % Place 1 patch onto the skin daily. Remove & Discard patch within 12 hours or as directed by MD    Historical Provider, MD  lisinopril-hydrochlorothiazide (PRINZIDE,ZESTORETIC) 20-12.5 MG per tablet Take 1 tablet by mouth daily.     Historical Provider,  MD  Meth-Hyo-M Barnett Hatter Phos-Ph Sal (URIBEL PO) Take 1 capsule by mouth daily.    Historical Provider, MD  Multiple Vitamin (MULTIVITAMIN) tablet Take 1 tablet by mouth daily.      Historical Provider, MD  promethazine (PHENERGAN) 12.5 MG tablet Take 12.5 mg by mouth every 6 (six) hours as needed for nausea or vomiting.    Historical Provider, MD  valACYclovir (VALTREX) 1000 MG tablet Take 1,000 mg by mouth 2 (two) times daily.    Historical Provider, MD   BP 155/80 mmHg  Pulse 71  Temp(Src) 98.1 F (36.7 C) (Oral)  Resp 16  SpO2 93% Physical Exam  Constitutional: He is  oriented to person, place, and time. He appears well-developed and well-nourished. No distress.  HENT:  Head: Normocephalic and atraumatic.  Mouth/Throat: Oropharynx is clear and moist. No oropharyngeal exudate.  Eyes: EOM are normal. Pupils are equal, round, and reactive to light.  Neck: Normal range of motion. Neck supple.  Cardiovascular: Normal rate and regular rhythm.  Exam reveals no friction rub.   No murmur heard. Pulmonary/Chest: Effort normal and breath sounds normal. No respiratory distress. He has no wheezes. He has no rales.  Abdominal: Soft. He exhibits no distension. There is no tenderness. There is no rebound.  Musculoskeletal: Normal range of motion. He exhibits no edema.  Neurological: He is alert and oriented to person, place, and time. No cranial nerve deficit. He exhibits normal muscle tone. Coordination normal.  L sided weakness  Skin: No rash noted. He is not diaphoretic.  Nursing note and vitals reviewed.   ED Course  Procedures (including critical care time) Labs Review Labs Reviewed  CBC  BASIC METABOLIC PANEL  I-STAT CG4 LACTIC ACID, ED  I-STAT TROPOININ, ED    Imaging Review Dg Forearm Left  10/19/2014   CLINICAL DATA:  Left forearm pain  EXAM: LEFT FOREARM - 2 VIEW  COMPARISON:  03/02/2010  FINDINGS: Bony demineralization. Slightly bowed appearance of the radius is chronic and perhaps due to an old injury, but no acute bony findings are observed. Mild spurring of the coronoid process of the ulna.  IMPRESSION: 1. No acute bony findings. Slight bowing of the radius may be due to an old injury, and appears chronic.   Electronically Signed   By: Van Clines M.D.   On: 10/19/2014 18:19   Ct Head Wo Contrast  10/19/2014   CLINICAL DATA:  Shingles on left side and back. Complains of fall. Weakness.  EXAM: CT HEAD WITHOUT CONTRAST  TECHNIQUE: Contiguous axial images were obtained from the base of the skull through the vertex without intravenous contrast.   COMPARISON:  01/28/2010  FINDINGS: There is prominence of the sulci and ventricles consistent with brain atrophy. There is low attenuation throughout the subcortical and periventricular white matter consistent with chronic microvascular disease. Old right alignment lacunar infarct is noted. There is no evidence for acute brain infarct, hemorrhage or mass. The paranasal sinuses are clear. The mastoid air cells are also clear. The calvarium is intact.  IMPRESSION: 1. No acute intracranial abnormalities. 2. Chronic microvascular disease and brain atrophy.   Electronically Signed   By: Kerby Moors M.D.   On: 10/19/2014 20:00   Dg Hand Complete Left  10/19/2014   CLINICAL DATA:  Fall.  EXAM: LEFT HAND - COMPLETE 3+ VIEW  COMPARISON:  None.  FINDINGS: There is diffuse decreased bone mineralization. There are degenerative changes over the radiocarpal joint and distal radioulnar joint. Mild degenerate changes over the interphalangeal  joints and MCP joints. No acute fracture or dislocation.  IMPRESSION: No acute findings.  Mild degenerative changes over the wrist and hand.   Electronically Signed   By: Marin Olp M.D.   On: 10/19/2014 18:19     EKG Interpretation   Date/Time:  Friday October 19 2014 18:25:15 EDT Ventricular Rate:  76 PR Interval:  75 QRS Duration: 91 QT Interval:  405 QTC Calculation: 455 R Axis:   -31 Text Interpretation:  Sinus rhythm Short PR interval Probable left atrial  enlargement Abnormal R-wave progression, early transition Left ventricular  hypertrophy ST elevation, consider inferior injury Artifact in lead(s) I  III aVL aVF V2 V6 No significant change since last tracing Confirmed by  Mingo Amber  MD, Lanard Arguijo ([id number]) on 10/19/2014 7:06:58 PM      MDM   Final diagnoses:  Skin tear of left forearm without complication, initial encounter  Fall  Fall  Weakness    81 year old male here after a fall. [place] multiple times at home. Was too weak to stand up out of bed and slid to  the floor. He fell again when getting back into the bed. He did not hit his head or lose consciousness. He has had some vomiting today also. He's currently under treatment for shingles at this time. Well-appearing, alert and oriented. He has some residual left-sided weakness from stroke. He has some skin tears to his left dorsal hand and dorsal distal forearm. Will x-ray the series. We'll check labs, EKG. Labs ok. EKG ok. Imaging ok. Patient's wife is concerned about taking him home. Social work and case range of stroke with patient. Patient has 24-hour home care starting in the morning. [facility] took patient home and I called to check on wife she said he was home and everything was okay.  Evelina Bucy, MD 10/20/14 [phone]

## 2014-10-22 NOTE — Telephone Encounter (Signed)
i will address at next office visit. -VRP

## 2014-11-01 ENCOUNTER — Emergency Department (HOSPITAL_COMMUNITY): Payer: Medicare PPO

## 2014-11-01 ENCOUNTER — Emergency Department (HOSPITAL_COMMUNITY)
Admission: EM | Admit: 2014-11-01 | Discharge: 2014-11-01 | Disposition: A | Discharge status: Home / Self Care | Payer: Medicare PPO | Attending: Emergency Medicine | Admitting: Emergency Medicine

## 2014-11-01 ENCOUNTER — Encounter (HOSPITAL_COMMUNITY): Payer: Self-pay | Admitting: Emergency Medicine

## 2014-11-01 ENCOUNTER — Telehealth: Payer: Self-pay | Admitting: Diagnostic Neuroimaging

## 2014-11-01 DIAGNOSIS — Z8673 Personal history of transient ischemic attack (TIA), and cerebral infarction without residual deficits: Secondary | ICD-10-CM | POA: Diagnosis not present

## 2014-11-01 DIAGNOSIS — Y939 Activity, unspecified: Secondary | ICD-10-CM | POA: Insufficient documentation

## 2014-11-01 DIAGNOSIS — Y92009 Unspecified place in unspecified non-institutional (private) residence as the place of occurrence of the external cause: Secondary | ICD-10-CM | POA: Diagnosis not present

## 2014-11-01 DIAGNOSIS — Z043 Encounter for examination and observation following other accident: Secondary | ICD-10-CM | POA: Diagnosis not present

## 2014-11-01 DIAGNOSIS — Z8546 Personal history of malignant neoplasm of prostate: Secondary | ICD-10-CM | POA: Insufficient documentation

## 2014-11-01 DIAGNOSIS — Z7982 Long term (current) use of aspirin: Secondary | ICD-10-CM | POA: Insufficient documentation

## 2014-11-01 DIAGNOSIS — R531 Weakness: Secondary | ICD-10-CM

## 2014-11-01 DIAGNOSIS — W1839XA Other fall on same level, initial encounter: Secondary | ICD-10-CM | POA: Insufficient documentation

## 2014-11-01 DIAGNOSIS — Z79899 Other long term (current) drug therapy: Secondary | ICD-10-CM | POA: Diagnosis not present

## 2014-11-01 DIAGNOSIS — G629 Polyneuropathy, unspecified: Secondary | ICD-10-CM | POA: Insufficient documentation

## 2014-11-01 DIAGNOSIS — Y999 Unspecified external cause status: Secondary | ICD-10-CM | POA: Insufficient documentation

## 2014-11-01 DIAGNOSIS — M1990 Unspecified osteoarthritis, unspecified site: Secondary | ICD-10-CM | POA: Diagnosis not present

## 2014-11-01 DIAGNOSIS — F0390 Unspecified dementia without behavioral disturbance: Secondary | ICD-10-CM | POA: Insufficient documentation

## 2014-11-01 DIAGNOSIS — R5383 Other fatigue: Secondary | ICD-10-CM | POA: Diagnosis present

## 2014-11-01 HISTORY — DX: Unspecified dementia, unspecified severity, without behavioral disturbance, psychotic disturbance, mood disturbance, and anxiety: F03.90

## 2014-11-01 HISTORY — DX: Polyneuropathy, unspecified: G62.9

## 2014-11-01 HISTORY — DX: Other postherpetic nervous system involvement: B02.29

## 2014-11-01 LAB — URINALYSIS, ROUTINE W REFLEX MICROSCOPIC
Glucose, UA: NEGATIVE mg/dL
KETONES UR: NEGATIVE mg/dL
Leukocytes, UA: NEGATIVE
NITRITE: NEGATIVE
PH: 5.5 (ref 5.0–8.0)
PROTEIN: 30 mg/dL — AB
Specific Gravity, Urine: 1.023 (ref 1.005–1.030)
Urobilinogen, UA: 0.2 mg/dL (ref 0.0–1.0)

## 2014-11-01 LAB — CBC WITH DIFFERENTIAL/PLATELET
BASOS ABS: 0 10*3/uL (ref 0.0–0.1)
Basophils Relative: 0 % (ref 0–1)
EOS ABS: 0.1 10*3/uL (ref 0.0–0.7)
EOS PCT: 1 % (ref 0–5)
HCT: 44.9 % (ref 39.0–52.0)
HEMOGLOBIN: 15.4 g/dL (ref 13.0–17.0)
LYMPHS ABS: 0.4 10*3/uL — AB (ref 0.7–4.0)
Lymphocytes Relative: 4 % — ABNORMAL LOW (ref 12–46)
MCH: 32.4 pg (ref 26.0–34.0)
MCHC: 34.3 g/dL (ref 30.0–36.0)
MCV: 94.5 fL (ref 78.0–100.0)
Monocytes Absolute: 0.9 10*3/uL (ref 0.1–1.0)
Monocytes Relative: 9 % (ref 3–12)
NEUTROS PCT: 86 % — AB (ref 43–77)
Neutro Abs: 9.3 10*3/uL — ABNORMAL HIGH (ref 1.7–7.7)
PLATELETS: 303 10*3/uL (ref 150–400)
RBC: 4.75 MIL/uL (ref 4.22–5.81)
RDW: 13 % (ref 11.5–15.5)
WBC: 10.7 10*3/uL — AB (ref 4.0–10.5)

## 2014-11-01 LAB — URINE MICROSCOPIC-ADD ON

## 2014-11-01 LAB — BASIC METABOLIC PANEL
ANION GAP: 11 (ref 5–15)
BUN: 27 mg/dL — ABNORMAL HIGH (ref 6–20)
CO2: 30 mmol/L (ref 22–32)
Calcium: 9.6 mg/dL (ref 8.9–10.3)
Chloride: 98 mmol/L — ABNORMAL LOW (ref 101–111)
Creatinine, Ser: 1.73 mg/dL — ABNORMAL HIGH (ref 0.61–1.24)
GFR calc non Af Amer: 34 mL/min — ABNORMAL LOW (ref >60)
GFR, EST AFRICAN AMERICAN: 39 mL/min — AB (ref >60)
Glucose, Bld: 111 mg/dL — ABNORMAL HIGH (ref 65–99)
POTASSIUM: 4 mmol/L (ref 3.5–5.1)
Sodium: 139 mmol/L (ref 135–145)

## 2014-11-01 LAB — LACTIC ACID, PLASMA: Lactic Acid, Venous: 1.1 mmol/L (ref 0.5–2.0)

## 2014-11-01 LAB — TROPONIN I: Troponin I: 0.03 ng/mL (ref <0.031)

## 2014-11-01 MED ORDER — TUBERCULIN PPD 5 UNIT/0.1ML ID SOLN
5.0000 [IU] | Freq: Once | INTRADERMAL | Status: DC
  Administered 2014-11-01: 5 [IU] via INTRADERMAL
  Filled 2014-11-01: qty 0.1

## 2014-11-01 MED ORDER — SODIUM CHLORIDE 0.9 % IV BOLUS (SEPSIS)
250.0000 mL | Freq: Once | INTRAVENOUS | Status: AC
  Administered 2014-11-01: 250 mL via INTRAVENOUS

## 2014-11-01 NOTE — ED Notes (Signed)
[facility] called for transportation back to home.

## 2014-11-01 NOTE — Discharge Instructions (Signed)
°Emergency Department Resource Guide °1) Find a Doctor and Pay Out of Pocket °Although you won't have to find out who is covered by your insurance plan, it is a good idea to ask around and get recommendations. You will then need to call the office and see if the doctor you have chosen will accept you as a new patient and what types of options they offer for patients who are self-pay. Some doctors offer discounts or will set up payment plans for their patients who do not have insurance, but you will need to ask so you aren't surprised when you get to your appointment. ° °2) Contact Your Local Health Department °Not all health departments have doctors that can see patients for sick visits, but many do, so it is worth a call to see if yours does. If you don't know where your local health department is, you can check in your phone book. The CDC also has a tool to help you locate your state's health department, and many state websites also have listings of all of their local health departments. ° °3) Find a Walk-in Clinic °If your illness is not likely to be very severe or complicated, you may want to try a walk in clinic. These are popping up all over the country in pharmacies, drugstores, and shopping centers. They're usually staffed by nurse practitioners or physician assistants that have been trained to treat common illnesses and complaints. They're usually fairly quick and inexpensive. However, if you have serious medical issues or chronic medical problems, these are probably not your best option. ° °No Primary Care Doctor: °- Call [facility] at  832-8000 - they can help you locate a primary care doctor that  accepts your insurance, provides certain services, etc. °- Physician Referral Service- 1-800-533-3463 ° °Chronic Pain Problems: °Organization         Address  Phone   Notes  °[facility]  (336) 297-2271 Patients need to be referred by their primary care doctor.  ° °Medication  Assistance: °Organization         Address  Phone   Notes  °[place] [facility] [address] °[place], [place] [address] (336) 641-8030 --Must be a resident of [place] °-- Must have NO insurance coverage whatsoever (no Medicaid/ Medicare, etc.) °-- The pt. MUST have a primary care doctor that directs their care regularly and follows them in the community °  °[facility]  (866) 331-1348   °[employer]  (888) 892-1162   ° °Agencies that provide inexpensive medical care: °Organization         Address  Phone   Notes  °[facility]  (336) 832-8035   °[facility]    (336) 832-7272   °[facility] [address] °[place], [place] [address] (336) 832-4777   °[facility] [address], °[place] (336) 271-4999   °[facility]    (336) 373-0678   °[facility]    (336) 272-1050   °[facility] ° [address], [place] Phone:  (336) 832-4444, Fax:  (336) 832-4440 Hours of Operation:  9 am - 6 pm, M-F.  Also accepts Medicaid/Medicare and self-pay.  °[facility] ° [address], [place] Phone: (336) 832-3150, Fax: (336) 832-3151. Hours of Operation:  8:30 am - 5:30 pm, M-F.  Also accepts Medicaid and self-pay.  °[facility] [place] [address], [place] Phone: (336) 878-6027   °[facility] [address], [place], [place] (336)723-1848, Ext. 123 Mondays & Thursdays: 7-9 AM.  First 15 patients are seen on a first come, first serve basis. °  ° °Medicaid-accepting [place] Providers: ° °Organization         Address  Phone   Notes  °[facility] [address], [place] (336) 641-2100 Also accepts self-pay patients.  °[facility] [address], [place] ° (336) 856-9996   °[facility] [address], [place]  (336) 288-8857   °[facility] [address] [place] [address], [place] (336) 299-7000   °Veita Bland [address], [place]  ° (336) 373-1557 Only accepts [place] Access Medicaid patients after they have their name applied to their card.  ° °Self-Pay (no insurance) in [place]: ° °Organization         Address  Phone   Notes  °Sickle Cell Patients, [facility] [address], [place] (336) 832-1970   °[facility] [address], [place] (336) 832-4400   ° [facility] ° [address], [place] (336) 992-4800   °[facility]/Dr. Osei-Bonsu ° [address] [place] [address], [place] or [address], [place] (336) 841-8500 Phone number for both [place] and [place] locations is the same.  °[facility] [address], [place] (336) 299-0000   °[facility] [address] [place] [address], [place] or [address] (336) 852-7530 °(336) 878-2260   °[facility] [address], [place] (336) 350-1642, phone; (336) 294-5005, fax Sees patients 1st and 3rd Saturday of every month.  Must not qualify for public or private insurance (i.e. Medicaid, Medicare, [place] Health Choice, Veterans' Benefits) • Household income should be no more than 200% of the poverty level •The clinic cannot treat you if you are pregnant or think you are pregnant • Sexually transmitted diseases are not treated at the clinic.  ° ° °Dental Care: °Organization         Address  Phone  Notes  °[place] [facility] [address], [place] (336) 641-6152 Accepts children up to age 21 who are enrolled in Medicaid or [place] Health Choice; pregnant women with a Medicaid card; and children who have applied for Medicaid or [place] Health Choice, but were declined, whose parents can pay a reduced fee at time of service.  °[place]  [facility] [place]  [address], [place] (336) 641-7733 Accepts children up to age 21 who are enrolled in Medicaid or [place] Health Choice; pregnant women with a Medicaid card; and children who have applied for Medicaid or [place] Health Choice, but were declined, whose parents can pay a reduced fee at time of service.  °[facility] ° [address],  (336) 641-4533 Patients are seen by appointment only. Walk-ins are not accepted. [facility] will see patients 18 years of age and older. °Monday - Tuesday (8am-5pm) °Most Wednesdays (8:30-5pm) °$30 per visit, cash only  °[facility] ° [address], [place] (336) 641-4533 Patients are seen by appointment only. Walk-ins are not accepted. [facility] will see patients 18 years of age and older. °One   Wednesday Evening (Monthly: Volunteer Based).  $30 per visit, cash only  °[facility]  (919) 537-3737 for adults; Children under age 4, call Graduate Pediatric Dentistry at (919) [phone]. Children aged 4-14, please call (919) [phone] to request a pediatric application. ° Dental services are provided in all areas of dental care including fillings, crowns and bridges, complete and partial dentures, implants, gum treatment, root canals, and extractions. Preventive care is also provided. Treatment is provided to both adults and children. °Patients are selected via a lottery and there is often a waiting list. °  °[facility] [address], °[place] ° (336) 763-8833 [email or web address] °  °[facility] [address], [place], [place] (336)723-1848, Ext. 123 Second and Fourth Thursday of each month, opens at 6:30 AM; Clinic ends at 9 AM.  Patients are seen on a first-come first-served basis, and a limited number are seen during each clinic.  ° °[facility] ° [address], [place], [place] (336) 723-7904    Eligibility Requirements °You must have lived in [place], [place], or [place] counties for at least the last three months. °  You cannot be eligible for state or federal sponsored healthcare insurance, including Veterans Administration, Medicaid, or Medicare. °  You generally cannot be eligible for healthcare insurance through your employer.  °  How to apply: °Eligibility screenings are held every Tuesday and Wednesday afternoon from 1:00 pm until 4:00 pm. You do not need an appointment for the interview!  °[facility] [address], [place], [place] 336-631-2330   °[facility]  336-342-8273   °[facility]  336-703-3100   °[facility]  336-570-6415   ° °Behavioral Health Resources in the Community: °Intensive Outpatient Programs °Organization         Address  Phone  Notes  °[facility] [address], [place], [place] 336-878-6098   °[facility] [address], [place], [place] 336-832-9800   °[facility] [address], [place], [place] ° 336-882-2125   °[facility] [address],  °[place], [place] 1-800-853-5163 or 336-641-4981   °Substance Abuse Resources °Organization         Address  Phone  Notes  °[facility]  336-882-2125   °[facility]  336-784-9470   °[facility]  336-285-9073   °[facility]  336-845-3988   °Residential & Outpatient Substance Abuse Program  1-800-659-3381   °Psychological Services °Organization         Address  Phone  Notes  °[facility]  336- 832-9600   °[facility]  336- 378-7881   °[facility] [address], [place] 1-800-853-5163 or 336-641-4981   ° °Mobile Crisis Teams °Organization         Address  Phone  Notes  °[facility], Mobile Crisis Care Unit  1-877-626-1772   °Assertive °[facility] ° [address]  [place], [place] 336-834-9664   °Sharon DeEsch [address] °[place] 336-554-5454   ° °Self-Help/Support Groups °Organization         Address  Phone             Notes  °[facility] [place] - variety of support groups  336- 373-1402 Call for more information  °Narcotics Anonymous (NA), [facility] [address], °[place]  2 meetings at this location  ° °  Residential Treatment Programs °Organization         Address  Phone  Notes  °[facility] [address],    °[place]  1-866-801-8205   °[facility] ° [address], [place], [place] 704-293-8524   °[facility] [address], [place] 336-845-3988 Admissions: 8am-3pm M-F  °[facility] [address]    °[place], [place] 336-841-1104   °[facility] [address], [place], [place] 336-379-7146   °[facility] [address]  °[place], [place] 336-285-9073   °[facility] Intensive Outpatient [address], [place], [place] 336-852-3033   °[facility] [address]  °[place], [place] 1-877-615-2722 or 336-784-9470   °[facility] [address] [place], [place] 336-227-7417 Accepts Medicaid  °[facility] [address]  °[place] 1-800-659-3381 Substance Abuse/Addiction Treatment  ° °[place] Behavioral Health Resources °Organization         Address  Phone  Notes  °[facility]  (888) 581-9988   °Julie Brannon, PhD [address] [place], [place]   (336) 349-5553 or (336) 951-0000   °[facility]   [address] °[place], [place] (336) 349-4454   °[facility] [address], [place], [place] (336) 342-8316 Insurance/Medicaid/sponsorship through [facility]  °[facility] [address]                                    [place], [place] (336) 342-8316 Therapy/tele-psych/case    °[facility] [address]  ° [place], [place] (336) 349-2233    °Dr. Arfeen  (336) 349-4544   °[facility] [place]  [employer] [place] [facility] 1) [address], [place] °2) [address], [place] °3)  [address], [place] (336) 349-3220 °(336) 342-7768 ° °(336) 342-8140   °[place] [facility] (336) 342-1394 or (336) 342-3537 (After Hours)    ° ° °Take your usual prescriptions as previously directed.  Call your regular medical doctor today to schedule a follow up appointment within the next 2 days.  Return to the Emergency Department immediately sooner if worsening.  ° °

## 2014-11-01 NOTE — Telephone Encounter (Signed)
Pts daughter , Vaughan Basta, has POA and would like to speak with someone about pts condition, would like to know what was done and said on his last visit here. Wife is telling the daughter several different things and feels that she is not getting a good answer from the wife. Would like to have copies of his medical records in a printed or digital format. Decisions about his long term care is going to be made . Daughter will be coming in from [place] and would like to have all the information possible to make long term care arrangements for her father, the patient, while she is here in town.Please call and advise. [phone]

## 2014-11-01 NOTE — ED Notes (Signed)
Tolerated po fluids

## 2014-11-01 NOTE — Telephone Encounter (Signed)
Spoke with daughter , Vaughan Basta, HC-POA who had many questions regarding her father's last office visit, his ED visit today and his home care he is receiving. She requests notes and states she will come to office when she is in [place] to obtain records.

## 2014-11-01 NOTE — Progress Notes (Addendum)
1525 CM and SW went to speak with wife but she is no longer present in pt ED room CM left wife a voice message informing her CM and SW needed to speak with her 1519 CM and SW met with wife to review options. Wife asked that CM, SW walk out of pt's room to nursing station.  Wife states they are getting assist from [facility] to get pt to a facility, the 2 daughters will be arriving "this weekend to discuss this"  Voiced interest in "[facility]"  Cm received a return call from [facility] coordinator during the time CM and SW were speaking with wife, Farrah confirms the referral for HH services came from [facility] Dr Penamulli, Confirms pt has HHRN, PT/OT, and SW Confirms pt & wife had a HH aide but refused aide because the family has obtained around the clock aide services from [facility]. Farrah confirmed HHSW met with wife for about 45 minutes without the pt one day who confirms wife needs respite services and pt had not agree to going to facility and at times pt is aggressive.  There is a present [facility] aide with wife and her name is Amy.  Cm clarified medicare guidelines with wife and Amy after Amy states her mother stated pt could be "admitted for dehydration" and wife requested pt to stay in [facility] "for two to three days.".  SW offered to contact [facility] and to assist with starting a FL2 with EDP help.  CM and SW discussed d/c home with further assist from Neurologist or Dr Mckenzie and home health SW. Wife informed CM that Dr Sun with "not in the picture at this time"    1245 81 yr old [employer] medicare choice ppo [place] covered male last seen by [facility] ED PM CM 10/19/14 with his wife at bedside. Patient lives at home with his wife. Per patient's wife, patient has home healtj services with [facility] , however she does not know what services he has. Patient's wife reports patient will be having 24/7 care with [facility] started 10/20/14. Wife wanted pt to stay overnight  in the hospital. [facility] ED PM CM informed patient's wife that this will not be possible as patient was found to not have a reason for medical admission at this time. Patient's wife became agitated and and requested to speak with SW who spoke with her on 10/19/14. Increased services by [facility] initiated Patient's wife reports patient has a wheelchair, urinal and bsc at home. wife refused hospital bed  Dr. Sun as pcp.  [facility] ED AM CM consulted by Dr McManus about wife stating when she walked in room that pt needed to stay at hospital to get to facility. [facility] informed EDP pcp was assisting to get pt to facility  Pt with 2 [facility] ED visits in the last 6 months  CM called Dr Sun office and spoke with Melissa and Rose. Rose states the pcp is not working on getting pt to a facility, Rose is aware pt has [facility] services 

## 2014-11-01 NOTE — ED Notes (Signed)
Delay in lab draw pt not in room. 

## 2014-11-01 NOTE — ED Notes (Signed)
Bed: BM84 Expected date:  Expected time:  Means of arrival:  Comments: Ems- elderly back pain, more irritated than usual, possible UTI

## 2014-11-01 NOTE — ED Notes (Signed)
Per EMS- Patient c/o bilateral low back pain that started last night. Patient's family states that he has the same pain like he has when he has a UTI. Patient is non ambulatory and has a history of prostate cancer.

## 2014-11-01 NOTE — ED Provider Notes (Signed)
CSN: [id number]     Arrival date & time 11/01/14  1219 History   First MD Initiated Contact with Patient 11/01/14 1227     Chief Complaint  Patient presents with  . Fatigue  . Fall      Patient is a 81 y.o. male presenting with fall. The history is provided by a caregiver, the EMS personnel and the spouse. The history is limited by the condition of the patient (Hx dementia).  Fall  Pt was seen at 1235. Per EMS, pt's wife, and pt's [place] aide/caregiver: Pt sent to the ED by his PMD "for a routine urine specimen." Pt's family states pt continues "weak," having frequent falls, and is "more irritated than usual" since his previous ED visit 10/19/14. Pt has multiple [place] in place and pt's wife states pt's PMD "is making arrangements to put him in a home next weekend." Pt's wife requesting "admission until then."  Pt himself only c/o chronic left upper back pain per his hx post herpetic neuralgia.    Past Medical History  Diagnosis Date  . ALLERGIC RHINITIS   . Arthritis     knee  . Prostate cancer     seeds  . CVA (cerebral infarction) 2011  . Dementia   . Peripheral neuropathy   . Post herpetic neuralgia 07/2014    left upper thoracic region   Past Surgical History  Procedure Laterality Date  . Total knee arthroplasty      left   Family History  Problem Relation Age of Onset  . Heart attack Father    History  Substance Use Topics  . Smoking status: Never Smoker   . Smokeless tobacco: Not on file  . Alcohol Use: 0.0 oz/week    0 Standard drinks or equivalent per week     Comment: wine once in a while     Review of Systems  Unable to perform ROS: Dementia      Allergies  Review of patient's allergies indicates no known allergies.  Home Medications   Prior to Admission medications   Medication Sig Start Date End Date Taking? Authorizing Provider  amLODipine (NORVASC) 2.5 MG tablet Take 2.5 mg by mouth daily.     Yes Historical Provider, MD   aspirin EC 81 MG tablet Take 81 mg by mouth daily.   Yes Historical Provider, MD  carbamazepine (TEGRETOL) 200 MG tablet Take 200 mg by mouth 3 (three) times daily. 10/17/14  Yes Historical Provider, MD  citalopram (CELEXA) 20 MG tablet Take 20 mg by mouth daily.  06/16/12  Yes Historical Provider, MD  CRESTOR 20 MG tablet Take 20 mg by mouth daily.  06/13/12  Yes Historical Provider, MD  hydrocortisone 2.5 % cream Apply 1 application topically daily as needed (skin irritation).  09/15/14  Yes Historical Provider, MD  ketoconazole (NIZORAL) 2 % shampoo Apply 1 application topically 2 (two) times a week. 10/10/14  Yes Historical Provider, MD  lidocaine (XYLOCAINE) 5 % ointment Apply 1 application topically daily as needed for mild pain or moderate pain.  10/17/14  Yes Historical Provider, MD  lisinopril-hydrochlorothiazide (PRINZIDE,ZESTORETIC) 20-12.5 MG per tablet Take 1 tablet by mouth daily.    Yes Historical Provider, MD  cyanocobalamin (,VITAMIN B-12,) 1000 MCG/ML injection Inject 1 mL (1,000 mcg total) into the skin every 30 (thirty) days. Instructions: to be given subq once a week for 4 weeks and then once a month for 6 months. 10/10/14   Penni Bombard, MD  gabapentin (  NEURONTIN) 300 MG capsule Take 3 capsules (900 mg total) by mouth 3 (three) times daily. Patient not taking: Reported on 10/19/2014 10/02/14   Penni Bombard, MD  Multiple Vitamin (MULTIVITAMIN) tablet Take 1 tablet by mouth daily.      Historical Provider, MD   BP 152/72 mmHg  Pulse 77  Temp(Src) 97.1 F (36.2 C) (Rectal)  Resp 16  Ht 5' 5.5" (1.664 m)  Wt 135 lb (61.236 kg)  BMI 22.12 kg/m2  SpO2 95% Physical Exam  1240: Physical examination:  Nursing notes reviewed; Vital signs and O2 SAT reviewed;  Constitutional: Well developed, Well nourished, Well hydrated, In no acute distress; Head:  Normocephalic, atraumatic; Eyes: EOMI, PERRL, No scleral icterus; ENMT: Mouth and pharynx normal, Mucous membranes moist; Neck:  Supple, Full range of motion, No lymphadenopathy; Cardiovascular: Regular rate and rhythm, No gallop; Respiratory: Breath sounds clear & equal bilaterally, No wheezes.  Speaking full sentences with ease, Normal respiratory effort/excursion; Chest: Nontender, Movement normal; Abdomen: Soft, Nontender, Nondistended, Normal bowel sounds; Genitourinary: No CVA tenderness; Spine:  No midline CS, TS, LS tenderness. No rash.;; Extremities: Pulses normal, No tenderness, No edema, No calf edema or asymmetry.; Neuro: Awake, alert, confused re: time, place, events per hx dementia. No facial droop. Speech clear. Left sided weakness per hx previous CVA, otherwise moves extremities on stretcher spontaneously..; Skin: Color normal, Warm, Dry.   ED Course  Procedures     EKG Interpretation None      MDM  MDM Reviewed: previous chart, nursing note and vitals Reviewed previous: labs Interpretation: labs, x-ray and CT scan     Results for orders placed or performed during the hospital encounter of 81/85/63  Basic metabolic panel  Result Value Ref Range   Sodium 139 135 - 145 mmol/L   Potassium 4.0 3.5 - 5.1 mmol/L   Chloride 98 (L) 101 - 111 mmol/L   CO2 30 22 - 32 mmol/L   Glucose, Bld 111 (H) 65 - 99 mg/dL   BUN 27 (H) 6 - 20 mg/dL   Creatinine, Ser 1.73 (H) 0.61 - 1.24 mg/dL   Calcium 9.6 8.9 - 10.3 mg/dL   GFR calc non Af Amer 34 (L) >60 mL/min   GFR calc Af Amer 39 (L) >60 mL/min   Anion gap 11 5 - 15  Troponin I  Result Value Ref Range   Troponin I 0.03 <0.031 ng/mL  Lactic acid, plasma  Result Value Ref Range   Lactic Acid, Venous 1.1 0.5 - 2.0 mmol/L  CBC with Differential  Result Value Ref Range   WBC 10.7 (H) 4.0 - 10.5 K/uL   RBC 4.75 4.22 - 5.81 MIL/uL   Hemoglobin 15.4 13.0 - 17.0 g/dL   HCT 44.9 39.0 - 52.0 %   MCV 94.5 78.0 - 100.0 fL   MCH 32.4 26.0 - 34.0 pg   MCHC 34.3 30.0 - 36.0 g/dL   RDW 13.0 11.5 - 15.5 %   Platelets 303 150 - 400 K/uL   Neutrophils Relative %  86 (H) 43 - 77 %   Neutro Abs 9.3 (H) 1.7 - 7.7 K/uL   Lymphocytes Relative 4 (L) 12 - 46 %   Lymphs Abs 0.4 (L) 0.7 - 4.0 K/uL   Monocytes Relative 9 3 - 12 %   Monocytes Absolute 0.9 0.1 - 1.0 K/uL   Eosinophils Relative 1 0 - 5 %   Eosinophils Absolute 0.1 0.0 - 0.7 K/uL   Basophils Relative 0 0 - 1 %  Basophils Absolute 0.0 0.0 - 0.1 K/uL  Urinalysis, Routine w reflex microscopic (not at [facility])  Result Value Ref Range   Color, Urine AMBER (A) YELLOW   APPearance CLEAR CLEAR   Specific Gravity, Urine 1.023 1.005 - 1.030   pH 5.5 5.0 - 8.0   Glucose, UA NEGATIVE NEGATIVE mg/dL   Hgb urine dipstick TRACE (A) NEGATIVE   Bilirubin Urine SMALL (A) NEGATIVE   Ketones, ur NEGATIVE NEGATIVE mg/dL   Protein, ur 30 (A) NEGATIVE mg/dL   Urobilinogen, UA 0.2 0.0 - 1.0 mg/dL   Nitrite NEGATIVE NEGATIVE   Leukocytes, UA NEGATIVE NEGATIVE  Urine microscopic-add on  Result Value Ref Range   WBC, UA 0-2 <3 WBC/hpf   RBC / HPF 11-20 <3 RBC/hpf   Bacteria, UA RARE RARE   Urine-Other MUCOUS PRESENT    Dg Chest 1 View 11/01/2014   CLINICAL DATA:  BILATERAL back pain beginning last night, history prostate cancer, weakness, multiple falls  EXAM: CHEST  1 VIEW  COMPARISON:  Due to patient condition exam was performed in a LEFT lateral decubitus position. Comparison made to 01/28/2010.  FINDINGS: Normal heart size and mediastinal contours.  Minimal dependent atelectasis LEFT lung base laterally potentially related to decubitus positioning.  Lungs otherwise clear.  No pleural effusion or pneumothorax.  Bones demineralized.  IMPRESSION: Minimal LEFT base atelectasis, potentially positional.   Electronically Signed   By: Lavonia Dana M.D.   On: 11/01/2014 13:35    Ct Head Wo Contrast 11/01/2014   CLINICAL DATA:  Falls.  Weakness.  EXAM: CT HEAD WITHOUT CONTRAST  TECHNIQUE: Contiguous axial images were obtained from the base of the skull through the vertex without intravenous contrast.  COMPARISON:  CT head  10/19/2014  FINDINGS: Moderate to advanced atrophy, stable from the prior study. Chronic microvascular ischemic change throughout the white matter. Chronic infarct right basal ganglia unchanged.  Negative for acute infarct.  Negative for hemorrhage or mass.  Calvarium intact.  IMPRESSION: Moderate to advanced atrophy. Moderate chronic microvascular ischemia. No acute abnormality.   Electronically Signed   By: Franchot Gallo M.D.   On: 11/01/2014 13:33    1500:  BUN/Cr mildly elevated; will dose judicious IVF. VS remain stable, afebrile. No clear indication for admission at this time. CM agrees there is no clear indication for admission at this time.  CM and SW have spoken with pt's wife and [facility] staff with her: Pt has extensive home services already, FL2 has been completed, SW will assist with attempts to place in facility (possibly today/tomorrow), SW requests to have PPD placed and pt can be d/c to home with family.      Francine Graven, DO 11/04/14 1110

## 2014-11-01 NOTE — Progress Notes (Signed)
CSW consulted with nurse who states she has given pt Tb test.   CSW gave patient's wife FL2 to assist with future placement.  Willette Brace [phone] ED CSW 11/01/2014 11:31 PM

## 2014-11-03 LAB — URINE CULTURE: CULTURE: NO GROWTH

## 2014-11-06 ENCOUNTER — Telehealth: Payer: Self-pay | Admitting: Internal Medicine

## 2014-11-06 ENCOUNTER — Ambulatory Visit (INDEPENDENT_AMBULATORY_CARE_PROVIDER_SITE_OTHER): Payer: Medicare PPO

## 2014-11-06 DIAGNOSIS — J309 Allergic rhinitis, unspecified: Secondary | ICD-10-CM

## 2014-11-06 NOTE — Telephone Encounter (Signed)
Date Mixed: 11/06/2014 Vial: AB Strength: 1:10 Here/Mail/Pick Up: Here Mixed By: Damita Eppard, CMA 

## 2014-11-08 ENCOUNTER — Emergency Department (HOSPITAL_COMMUNITY): Payer: Medicare PPO

## 2014-11-08 ENCOUNTER — Encounter (HOSPITAL_COMMUNITY): Payer: Self-pay | Admitting: Emergency Medicine

## 2014-11-08 ENCOUNTER — Inpatient Hospital Stay (HOSPITAL_COMMUNITY)
Admission: EM | Admit: 2014-11-08 | Discharge: 2014-11-13 | DRG: 177 | Disposition: A | Discharge status: Hospice | Payer: Medicare PPO | Attending: Internal Medicine | Admitting: Internal Medicine

## 2014-11-08 DIAGNOSIS — R0902 Hypoxemia: Secondary | ICD-10-CM | POA: Diagnosis present

## 2014-11-08 DIAGNOSIS — G8194 Hemiplegia, unspecified affecting left nondominant side: Secondary | ICD-10-CM | POA: Diagnosis present

## 2014-11-08 DIAGNOSIS — J690 Pneumonitis due to inhalation of food and vomit: Secondary | ICD-10-CM | POA: Diagnosis not present

## 2014-11-08 DIAGNOSIS — N179 Acute kidney failure, unspecified: Secondary | ICD-10-CM

## 2014-11-08 DIAGNOSIS — G9341 Metabolic encephalopathy: Secondary | ICD-10-CM | POA: Diagnosis present

## 2014-11-08 DIAGNOSIS — Z8673 Personal history of transient ischemic attack (TIA), and cerebral infarction without residual deficits: Secondary | ICD-10-CM | POA: Diagnosis not present

## 2014-11-08 DIAGNOSIS — Z91013 Allergy to seafood: Secondary | ICD-10-CM | POA: Diagnosis not present

## 2014-11-08 DIAGNOSIS — F0390 Unspecified dementia without behavioral disturbance: Secondary | ICD-10-CM | POA: Diagnosis present

## 2014-11-08 DIAGNOSIS — Z515 Encounter for palliative care: Secondary | ICD-10-CM

## 2014-11-08 DIAGNOSIS — F329 Major depressive disorder, single episode, unspecified: Secondary | ICD-10-CM | POA: Diagnosis present

## 2014-11-08 DIAGNOSIS — I639 Cerebral infarction, unspecified: Secondary | ICD-10-CM | POA: Diagnosis present

## 2014-11-08 DIAGNOSIS — C61 Malignant neoplasm of prostate: Secondary | ICD-10-CM | POA: Diagnosis present

## 2014-11-08 DIAGNOSIS — R040 Epistaxis: Secondary | ICD-10-CM | POA: Diagnosis not present

## 2014-11-08 DIAGNOSIS — I10 Essential (primary) hypertension: Secondary | ICD-10-CM | POA: Diagnosis present

## 2014-11-08 DIAGNOSIS — Z66 Do not resuscitate: Secondary | ICD-10-CM | POA: Diagnosis present

## 2014-11-08 DIAGNOSIS — J189 Pneumonia, unspecified organism: Secondary | ICD-10-CM | POA: Diagnosis not present

## 2014-11-08 DIAGNOSIS — B0229 Other postherpetic nervous system involvement: Secondary | ICD-10-CM | POA: Diagnosis not present

## 2014-11-08 DIAGNOSIS — K921 Melena: Secondary | ICD-10-CM | POA: Diagnosis not present

## 2014-11-08 DIAGNOSIS — Z79899 Other long term (current) drug therapy: Secondary | ICD-10-CM

## 2014-11-08 DIAGNOSIS — Y95 Nosocomial condition: Secondary | ICD-10-CM | POA: Diagnosis present

## 2014-11-08 DIAGNOSIS — Z7982 Long term (current) use of aspirin: Secondary | ICD-10-CM

## 2014-11-08 DIAGNOSIS — F0150 Vascular dementia without behavioral disturbance: Secondary | ICD-10-CM | POA: Diagnosis present

## 2014-11-08 DIAGNOSIS — R319 Hematuria, unspecified: Secondary | ICD-10-CM | POA: Diagnosis present

## 2014-11-08 DIAGNOSIS — Z8249 Family history of ischemic heart disease and other diseases of the circulatory system: Secondary | ICD-10-CM

## 2014-11-08 DIAGNOSIS — F0391 Unspecified dementia with behavioral disturbance: Secondary | ICD-10-CM | POA: Diagnosis not present

## 2014-11-08 DIAGNOSIS — Z7401 Bed confinement status: Secondary | ICD-10-CM | POA: Diagnosis not present

## 2014-11-08 DIAGNOSIS — Z96652 Presence of left artificial knee joint: Secondary | ICD-10-CM | POA: Diagnosis present

## 2014-11-08 DIAGNOSIS — R4182 Altered mental status, unspecified: Secondary | ICD-10-CM | POA: Diagnosis present

## 2014-11-08 LAB — CBC WITH DIFFERENTIAL/PLATELET
Basophils Absolute: 0 10*3/uL (ref 0.0–0.1)
Basophils Relative: 0 % (ref 0–1)
Eosinophils Absolute: 0 10*3/uL (ref 0.0–0.7)
Eosinophils Relative: 0 % (ref 0–5)
HCT: 40.2 % (ref 39.0–52.0)
Hemoglobin: 13 g/dL (ref 13.0–17.0)
Lymphocytes Relative: 2 % — ABNORMAL LOW (ref 12–46)
Lymphs Abs: 0.3 10*3/uL — ABNORMAL LOW (ref 0.7–4.0)
MCH: 31.8 pg (ref 26.0–34.0)
MCHC: 32.3 g/dL (ref 30.0–36.0)
MCV: 98.3 fL (ref 78.0–100.0)
Monocytes Absolute: 0.8 10*3/uL (ref 0.1–1.0)
Monocytes Relative: 5 % (ref 3–12)
Neutro Abs: 14.6 10*3/uL — ABNORMAL HIGH (ref 1.7–7.7)
Neutrophils Relative %: 93 % — ABNORMAL HIGH (ref 43–77)
Platelets: 234 10*3/uL (ref 150–400)
RBC: 4.09 MIL/uL — ABNORMAL LOW (ref 4.22–5.81)
RDW: 13.8 % (ref 11.5–15.5)
WBC: 15.7 10*3/uL — ABNORMAL HIGH (ref 4.0–10.5)

## 2014-11-08 LAB — I-STAT CG4 LACTIC ACID, ED: Lactic Acid, Venous: 0.99 mmol/L (ref 0.5–2.0)

## 2014-11-08 LAB — COMPREHENSIVE METABOLIC PANEL
ALT: 16 U/L — ABNORMAL LOW (ref 17–63)
AST: 21 U/L (ref 15–41)
Albumin: 3.2 g/dL — ABNORMAL LOW (ref 3.5–5.0)
Alkaline Phosphatase: 73 U/L (ref 38–126)
Anion gap: 8 (ref 5–15)
BUN: 74 mg/dL — ABNORMAL HIGH (ref 6–20)
CO2: 29 mmol/L (ref 22–32)
Calcium: 9.1 mg/dL (ref 8.9–10.3)
Chloride: 104 mmol/L (ref 101–111)
Creatinine, Ser: 3.29 mg/dL — ABNORMAL HIGH (ref 0.61–1.24)
GFR calc Af Amer: 18 mL/min — ABNORMAL LOW (ref >60)
GFR calc non Af Amer: 16 mL/min — ABNORMAL LOW (ref >60)
Glucose, Bld: 134 mg/dL — ABNORMAL HIGH (ref 65–99)
Potassium: 4.7 mmol/L (ref 3.5–5.1)
Sodium: 141 mmol/L (ref 135–145)
Total Bilirubin: 0.6 mg/dL (ref 0.3–1.2)
Total Protein: 6.7 g/dL (ref 6.5–8.1)

## 2014-11-08 MED ORDER — ONDANSETRON HCL 4 MG PO TABS: 4.0000 mg | ORAL_TABLET | Freq: Four times a day (QID) | ORAL | Status: DC | PRN

## 2014-11-08 MED ORDER — LIDOCAINE 5 % EX PTCH
2.0000 | MEDICATED_PATCH | Freq: Every day | CUTANEOUS | Status: DC
  Administered 2014-11-09 – 2014-11-13 (×5): 2 via TRANSDERMAL
  Filled 2014-11-08 (×6): qty 2

## 2014-11-08 MED ORDER — KETOCONAZOLE 2 % EX SHAM
1.0000 "application " | MEDICATED_SHAMPOO | CUTANEOUS | Status: DC
  Filled 2014-11-08: qty 120

## 2014-11-08 MED ORDER — SODIUM CHLORIDE 0.45 % IV SOLN
INTRAVENOUS | Status: DC
  Administered 2014-11-08: 23:00:00 via INTRAVENOUS

## 2014-11-08 MED ORDER — GABAPENTIN 300 MG PO CAPS
600.0000 mg | ORAL_CAPSULE | Freq: Every day | ORAL | Status: DC
  Administered 2014-11-08: 600 mg via ORAL
  Filled 2014-11-08 (×2): qty 2

## 2014-11-08 MED ORDER — VANCOMYCIN HCL IN DEXTROSE 1-5 GM/200ML-% IV SOLN
1000.0000 mg | Freq: Once | INTRAVENOUS | Status: AC
  Administered 2014-11-08: 1000 mg via INTRAVENOUS
  Filled 2014-11-08: qty 200

## 2014-11-08 MED ORDER — HYDROCHLOROTHIAZIDE 12.5 MG PO CAPS
12.5000 mg | ORAL_CAPSULE | Freq: Every day | ORAL | Status: DC
  Administered 2014-11-08: 12.5 mg via ORAL
  Filled 2014-11-08 (×2): qty 1

## 2014-11-08 MED ORDER — LISINOPRIL-HYDROCHLOROTHIAZIDE 20-12.5 MG PO TABS: 1.0000 | ORAL_TABLET | Freq: Every day | ORAL | Status: DC

## 2014-11-08 MED ORDER — PIPERACILLIN-TAZOBACTAM IN DEX 2-0.25 GM/50ML IV SOLN
2.2500 g | Freq: Three times a day (TID) | INTRAVENOUS | Status: DC
  Administered 2014-11-09 – 2014-11-11 (×7): 2.25 g via INTRAVENOUS
  Filled 2014-11-08 (×8): qty 50

## 2014-11-08 MED ORDER — ASPIRIN EC 81 MG PO TBEC
81.0000 mg | DELAYED_RELEASE_TABLET | Freq: Every day | ORAL | Status: DC
  Administered 2014-11-08: 81 mg via ORAL
  Filled 2014-11-08 (×2): qty 1

## 2014-11-08 MED ORDER — ONDANSETRON HCL 4 MG/2ML IJ SOLN: 4.0000 mg | Freq: Four times a day (QID) | INTRAMUSCULAR | Status: DC | PRN

## 2014-11-08 MED ORDER — CARBAMAZEPINE 100 MG PO CHEW
100.0000 mg | CHEWABLE_TABLET | ORAL | Status: DC
  Administered 2014-11-09: 100 mg via ORAL
  Filled 2014-11-08 (×4): qty 1

## 2014-11-08 MED ORDER — ENOXAPARIN SODIUM 30 MG/0.3ML ~~LOC~~ SOLN
30.0000 mg | Freq: Every day | SUBCUTANEOUS | Status: DC
Start: 2014-11-08 — End: 2014-11-11
  Administered 2014-11-08 – 2014-11-10 (×3): 30 mg via SUBCUTANEOUS
  Filled 2014-11-08 (×4): qty 0.3

## 2014-11-08 MED ORDER — BACITRACIN-NEOMYCIN-POLYMYXIN OINTMENT TUBE
1.0000 "application " | TOPICAL_OINTMENT | Freq: Every day | CUTANEOUS | Status: DC
  Filled 2014-11-08: qty 15

## 2014-11-08 MED ORDER — CITALOPRAM HYDROBROMIDE 20 MG PO TABS
20.0000 mg | ORAL_TABLET | Freq: Every day | ORAL | Status: DC
  Filled 2014-11-08: qty 1

## 2014-11-08 MED ORDER — CARBAMAZEPINE 200 MG PO TABS
200.0000 mg | ORAL_TABLET | Freq: Every day | ORAL | Status: DC
  Administered 2014-11-08: 200 mg via ORAL
  Filled 2014-11-08 (×2): qty 1

## 2014-11-08 MED ORDER — LISINOPRIL 20 MG PO TABS
20.0000 mg | ORAL_TABLET | Freq: Every day | ORAL | Status: DC
  Administered 2014-11-08: 20 mg via ORAL
  Filled 2014-11-08 (×2): qty 1

## 2014-11-08 MED ORDER — PIPERACILLIN-TAZOBACTAM 3.375 G IVPB 30 MIN
3.3750 g | Freq: Three times a day (TID) | INTRAVENOUS | Status: DC
  Administered 2014-11-08: 3.375 g via INTRAVENOUS
  Filled 2014-11-08: qty 50

## 2014-11-08 NOTE — Progress Notes (Signed)
ANTIBIOTIC CONSULT NOTE - INITIAL  Pharmacy Consult for vancomycin and zosyn Indication: pneumonia  Allergies  Allergen Reactions  . Shellfish Allergy     Unknown reaction    Patient Measurements: weight 61 kg, height 65 inches    Vital Signs: Temp: 97.5 F (36.4 C) (07/14 1847) Temp Source: Oral (07/14 1847) BP: 109/58 mmHg (07/14 1847) Pulse Rate: 79 (07/14 1847) Intake/Output from previous day:   Intake/Output from this shift:    Labs:  Recent Labs  11/08/14 1815  WBC 15.7*  HGB 13.0  PLT 234  CREATININE 3.29*   Estimated Creatinine Clearance: 13.7 mL/min (by C-G formula based on Cr of 3.29). No results for input(s): VANCOTROUGH, VANCOPEAK, VANCORANDOM, GENTTROUGH, GENTPEAK, GENTRANDOM, TOBRATROUGH, TOBRAPEAK, TOBRARND, AMIKACINPEAK, AMIKACINTROU, AMIKACIN in the last 72 hours.   Microbiology: Recent Results (from the past 720 hour(s))  Urine culture     Status: None   Collection Time: 11/01/14  1:49 PM  Result Value Ref Range Status   Specimen Description URINE, RANDOM  Final   Special Requests NONE  Final   Culture   Final    NO GROWTH 2 DAYS Performed at [facility]    Report Status 11/03/2014 FINAL  Final    Medical History: Past Medical History  Diagnosis Date  . ALLERGIC RHINITIS   . Arthritis     knee  . Prostate cancer     seeds  . CVA (cerebral infarction) 2011  . Dementia   . Peripheral neuropathy   . Post herpetic neuralgia 07/2014    left upper thoracic region    Medications:  See med rec   Assessment: Patient's an 81 y.o M who presents to the ED from [facility] senior living with c/o hematuria and lethargy. CXR showed R perihilar opacity.  To start vancomycin and zosyn for suspected PNA.  Scr elevated at 3.29 (crcl~13), LA 0.99   Goal of Therapy:  Vancomycin trough level 15-20 mcg/ml  Plan:  -  Zosyn 3.375 gm IV x1 over 30 minutes (per MD), then zosyn to 2.25 gm IV q8h - vancomycin 1gm IV x1.  With poor renal  function, will check vancomycin level in ~48 hours and redose if level is <20. - f/u renal function  Devin Going, Matthe Sloane P 11/08/2014,8:23 PM

## 2014-11-08 NOTE — ED Notes (Signed)
Bed: JG50 Expected date:  Expected time:  Means of arrival:  Comments: EMS- 81yo, hematuria, possible aspiration

## 2014-11-08 NOTE — ED Notes (Signed)
Per ems pt from [facility] senior living. Per ems per staff per pt co hematuria, and lethargy. Pt alert and oriented to self and event. Hx dementia. Per staff "pt looked like he was falling asleep during physical therapy. Per staff pt was given water while semi fowlers position, pt coughed and made gurgling sound simulating possible aspiration. O2 sat was 90 on RA. Per ems lungs sound clear.

## 2014-11-08 NOTE — ED Provider Notes (Signed)
The patient is an 81 year old male, presents with altered mental status, reportedly having some hematuria, decreased level of consciousness, possibly hypoxia. On exam the patient has normal respiratory rate but appears somnolent, he is arousable, able to follow occasional commands such as deep breathing but does not speak when asked questions. He has muscle wasting, soft nontender abdomen, mild hypoxia, no tachycardia. Review of the labs show that he has acute renal failure on chronic renal failure, pneumonia, leukocytosis, needs at antibiotics, in and out catheterization, hydration and admission to the hospital. This was discussed with the patient and the caregiver at the bedside, they are in agreement.  Medical screening examination/treatment/procedure(s) were conducted as a shared visit with non-physician practitioner(s) and myself.  I personally evaluated the patient during the encounter.  Clinical Impression:   Final diagnoses:  HCAP (healthcare-associated pneumonia)        EKG Interpretation  Date/Time:  Thursday November 08 2014 16:46:26 EDT Ventricular Rate:  80 PR Interval:  225 QRS Duration: 101 QT Interval:  361 QTC Calculation: 416 R Axis:   -39 Text Interpretation:  Sinus rhythm Prolonged PR interval Left axis deviation Nonspecific T abnormalities, lateral leads since last tracing no significant change Confirmed by Sabra Heck  MD, Micaiah Remillard ([id number]) on 11/08/2014 5:31:01 PM        Noemi Chapel, MD 11/13/14 1956

## 2014-11-08 NOTE — H&P (Signed)
[facility] History and Physical  Blake Harrison WVP:[id number] DOB: 17-Oct-1925 DOA: 11/08/2014  Referring physician: Harrell Gave lawyer, PA-C PCP: Sandi Mariscal, MD   Chief Complaint: Change in mental status  HPI: Blake Harrison is a 81 y.o. male  below past medical history who was brought from [facility] living Due to decreased mentation had hematuria. Per the senior living center staff patient was receiving physical therapy and was falling asleep, so subsequently he was trying to drink some water and coughed, may what is described as accordingly sound and was noticed to have a difficulty breathing with an O2 saturation of 90% on room air. Subsequently the patient was transported to this emergency department for evaluation.  The patient has a history of dementia, likely due to  multi-infarcts events and is unable to provide further history to saturation is in the mid to high 90s with supplemental oxygen. He is unable to answer any questions at this time.   Review of Systems:  Unable to obtain due to the patient's mental status.  Past Medical History  Diagnosis Date  . ALLERGIC RHINITIS   . Arthritis     knee  . Prostate cancer     seeds  . CVA (cerebral infarction) 2011  . Dementia   . Peripheral neuropathy   . Post herpetic neuralgia 07/2014    left upper thoracic region   Past Surgical History  Procedure Laterality Date  . Total knee arthroplasty      left   Social History:  reports that he has never smoked. He does not have any smokeless tobacco history on file. He reports that he drinks alcohol. He reports that he does not use illicit drugs.  Allergies  Allergen Reactions  . Shellfish Allergy     Unknown reaction    Family History  Problem Relation Age of Onset  . Heart attack Father      Prior to Admission medications   Medication Sig Start Date End Date Taking? Authorizing Provider  aspirin EC 81 MG tablet Take 81 mg by mouth daily.   Yes  Historical Provider, MD  carbamazepine (TEGRETOL) 100 MG chewable tablet Chew 100 mg by mouth 2 (two) times daily.   Yes Historical Provider, MD  carbamazepine (TEGRETOL) 200 MG tablet Take 200 mg by mouth at bedtime.  10/17/14  Yes Historical Provider, MD  citalopram (CELEXA) 20 MG tablet Take 20 mg by mouth daily.  06/16/12  Yes Historical Provider, MD  cyanocobalamin (,VITAMIN B-12,) 1000 MCG/ML injection Inject 1 mL (1,000 mcg total) into the skin every 30 (thirty) days. Instructions: to be given subq once a week for 4 weeks and then once a month for 6 months. 10/10/14  Yes Penni Bombard, MD  gabapentin (NEURONTIN) 300 MG capsule Take 3 capsules (900 mg total) by mouth 3 (three) times daily. Patient taking differently: Take 600 mg by mouth at bedtime.  10/02/14  Yes Penni Bombard, MD  hydrocortisone 2.5 % cream Apply 1 application topically daily as needed (skin irritation).  09/15/14  Yes Historical Provider, MD  ketoconazole (NIZORAL) 2 % shampoo Apply 1 application topically 2 (two) times a week. 10/10/14  Yes Historical Provider, MD  lidocaine (LIDODERM) 5 % Place 2 patches onto the skin at bedtime. Apply 2 patches to left side of chest. On 12 hrs, off 12 hrs. 11/06/14  Yes Historical Provider, MD  lisinopril-hydrochlorothiazide (PRINZIDE,ZESTORETIC) 20-12.5 MG per tablet Take 1 tablet by mouth daily.    Yes Historical Provider, MD  Multiple Vitamin (MULTIVITAMIN) tablet Take 1 tablet by mouth daily.     Yes Historical Provider, MD  neomycin-bacitracin-polymyxin (NEOSPORIN) [phone] ointment Apply 1 application topically daily. Apply to abrasion until healed   Yes Historical Provider, MD  NUTRITIONAL SUPPLEMENTS PO Take 237 mLs by mouth 4 (four) times daily.   Yes Historical Provider, MD  promethazine (PHENERGAN) 25 MG tablet Take 25 mg by mouth every 8 (eight) hours as needed for nausea or vomiting. Do not exceed 3 tabs /24 hrs   Yes Historical Provider, MD   Physical Exam: Filed  Vitals:   11/08/14 1627 11/08/14 1847 11/08/14 2036 11/08/14 2306  BP: 118/54 109/58 123/61 110/60  Pulse: 85 79 77 77  Temp: 98.3 F (36.8 C) 97.5 F (36.4 C) 98.5 F (36.9 C) 98.6 F (37 C)  TempSrc: Oral Oral Oral Oral  Resp: 21 21 22 20   SpO2: 93% 97% 98% 97%    Wt Readings from Last 3 Encounters:  11/01/14 61.236 kg (135 lb)  06/30/12 67.858 kg (149 lb 9.6 oz)  06/30/11 63.866 kg (140 lb 12.8 oz)    General:  Appears mildly anxious, but stable. Eyes: PERRL, normal lids, irises & conjunctiva ENT: grossly normal hearing, lips & tongue are very dry Neck: no LAD, masses or thyromegaly Cardiovascular: RRR, positive systolic murmur/r/g. No LE edema. Telemetry: SR, no arrhythmias  Respiratory: Decreased breath sounds and basilar rales bilaterally, right more than left, mild rhonchi. Abdomen: soft, ntnd Skin: The skin is dry, with no rash or induration seen on limited exam Musculoskeletal: grossly normal tone BUE/BLE Psychiatric: The patient is awake, but nonverbal follows simple commands sometimes Neurologic: Right-sided hemiparalysis           Labs on Admission:  Basic Metabolic Panel:  Recent Labs Lab 11/08/14 1815  NA 141  K 4.7  CL 104  CO2 29  GLUCOSE 134  BUN 74  CREATININE 3.29  CALCIUM 9.1   Liver Function Tests:  Recent Labs Lab 11/08/14 1815  AST 21  ALT 16  ALKPHOS 73  BILITOT 0.6  PROT 6.7  ALBUMIN 3.2   No results for input(s): LIPASE, AMYLASE in the last 168 hours. No results for input(s): AMMONIA in the last 168 hours. CBC:  Recent Labs Lab 11/08/14 1815  WBC 15.7  NEUTROABS 14.6  HGB 13.0  HCT 40.2  MCV 98.3  PLT 234   Cardiac Enzymes: No results for input(s): CKTOTAL, CKMB, CKMBINDEX, TROPONINI in the last 168 hours.  BNP (last 3 results) No results for input(s): BNP in the last 8760 hours.  ProBNP (last 3 results) No results for input(s): PROBNP in the last 8760 hours.  CBG: No results for input(s): GLUCAP in the  last 168 hours.  Radiological Exams on Admission: Dg Chest Port 1 View  11/08/2014   CLINICAL DATA:  Weakness.  EXAM: PORTABLE CHEST - 1 VIEW  COMPARISON:  January 28, 2010.  FINDINGS: Stable cardiomegaly. No pneumothorax or pleural effusion is noted. Left lung is clear. Interval development of right perihilar opacity is noted concerning for possible pneumonia. Mild degenerative joint disease of right glenohumeral joint is noted.  IMPRESSION: Right perihilar opacity is noted concerning for possible pneumonia. Followup radiographs are recommended to ensure resolution and rule out underlying neoplasm.   Electronically Signed   By: Marijo Conception, M.D.   On: 11/08/2014 17:18    EKG: Independently reviewed.  Sinus rhythm Prolonged PR interval Left axis deviation Nonspecific T abnormalities, lateral leads  Assessment/Plan Principal Problem:  HCAP (healthcare-associated pneumonia) Active Problems:   Prerenal Azotemia.   PROSTATE CANCER   Post herpetic neuralgia   Hypertension   Dementia   CVA (cerebral infarction)  Admit for treatment of healthcare associated pneumonia. Pharmacy consulted by the emergency department to dose of vancomycin and Zosyn. Continue supplemental oxygen. I will add bronchodilators. Sputum culture and sensitivity. Aspiration precautions.  Will hydrate for the next 30 hours with 3 L of half normal saline and follow up his BUN and creatinine.  Continue pertinent current medications and supportive care.  Code Status: Full code. DVT Prophylaxis:  Lovenox. Family Communication: No relatives or friends were present in the ER with the patient, but below is the contact information for his next of kin  [place], [place]  Disposition Plan:Skilled nursing facility.  Time spent: 60 minutes  Reubin Milan [facility] Pager [phone]

## 2014-11-08 NOTE — ED Notes (Addendum)
Waiting for a tech to complete in and out cath

## 2014-11-09 DIAGNOSIS — J189 Pneumonia, unspecified organism: Secondary | ICD-10-CM | POA: Insufficient documentation

## 2014-11-09 DIAGNOSIS — B0229 Other postherpetic nervous system involvement: Secondary | ICD-10-CM

## 2014-11-09 DIAGNOSIS — F0390 Unspecified dementia without behavioral disturbance: Secondary | ICD-10-CM

## 2014-11-09 DIAGNOSIS — Z515 Encounter for palliative care: Secondary | ICD-10-CM | POA: Insufficient documentation

## 2014-11-09 DIAGNOSIS — N179 Acute kidney failure, unspecified: Secondary | ICD-10-CM

## 2014-11-09 DIAGNOSIS — I639 Cerebral infarction, unspecified: Secondary | ICD-10-CM

## 2014-11-09 LAB — CBC
HCT: 36.8 % — ABNORMAL LOW (ref 39.0–52.0)
HEMOGLOBIN: 11.7 g/dL — AB (ref 13.0–17.0)
MCH: 30.9 pg (ref 26.0–34.0)
MCHC: 31.8 g/dL (ref 30.0–36.0)
MCV: 97.1 fL (ref 78.0–100.0)
Platelets: 221 10*3/uL (ref 150–400)
RBC: 3.79 MIL/uL — AB (ref 4.22–5.81)
RDW: 13.8 % (ref 11.5–15.5)
WBC: 15.2 10*3/uL — AB (ref 4.0–10.5)

## 2014-11-09 LAB — COMPREHENSIVE METABOLIC PANEL
ALK PHOS: 65 U/L (ref 38–126)
ALT: 15 U/L — ABNORMAL LOW (ref 17–63)
AST: 15 U/L (ref 15–41)
Albumin: 2.9 g/dL — ABNORMAL LOW (ref 3.5–5.0)
Anion gap: 8 (ref 5–15)
BILIRUBIN TOTAL: 0.6 mg/dL (ref 0.3–1.2)
BUN: 70 mg/dL — ABNORMAL HIGH (ref 6–20)
CALCIUM: 8.6 mg/dL — AB (ref 8.9–10.3)
CO2: 27 mmol/L (ref 22–32)
CREATININE: 3.06 mg/dL — AB (ref 0.61–1.24)
Chloride: 103 mmol/L (ref 101–111)
GFR calc Af Amer: 20 mL/min — ABNORMAL LOW (ref >60)
GFR, EST NON AFRICAN AMERICAN: 17 mL/min — AB (ref >60)
GLUCOSE: 131 mg/dL — AB (ref 65–99)
Potassium: 4.4 mmol/L (ref 3.5–5.1)
Sodium: 138 mmol/L (ref 135–145)
TOTAL PROTEIN: 6.2 g/dL — AB (ref 6.5–8.1)

## 2014-11-09 LAB — URINALYSIS, ROUTINE W REFLEX MICROSCOPIC
GLUCOSE, UA: NEGATIVE mg/dL
Ketones, ur: NEGATIVE mg/dL
Nitrite: NEGATIVE
PH: 5 (ref 5.0–8.0)
Protein, ur: 30 mg/dL — AB
SPECIFIC GRAVITY, URINE: 1.022 (ref 1.005–1.030)
Urobilinogen, UA: 0.2 mg/dL (ref 0.0–1.0)

## 2014-11-09 LAB — MRSA PCR SCREENING: MRSA BY PCR: NEGATIVE

## 2014-11-09 LAB — URINE MICROSCOPIC-ADD ON

## 2014-11-09 LAB — HIV ANTIBODY (ROUTINE TESTING W REFLEX): HIV Screen 4th Generation wRfx: NONREACTIVE

## 2014-11-09 LAB — STREP PNEUMONIAE URINARY ANTIGEN: Strep Pneumo Urinary Antigen: NEGATIVE

## 2014-11-09 MED ORDER — DEXTROSE-NACL 5-0.45 % IV SOLN
INTRAVENOUS | Status: DC
  Administered 2014-11-09 – 2014-11-11 (×2): via INTRAVENOUS

## 2014-11-09 MED ORDER — LORAZEPAM 2 MG/ML IJ SOLN: 1.0000 mg | INTRAMUSCULAR | Status: DC | PRN

## 2014-11-09 MED ORDER — BISACODYL 10 MG RE SUPP: 10.0000 mg | Freq: Every day | RECTAL | Status: DC | PRN

## 2014-11-09 MED ORDER — HYDROMORPHONE HCL 1 MG/ML IJ SOLN: 0.5000 mg | INTRAMUSCULAR | Status: DC | PRN

## 2014-11-09 MED ORDER — ASPIRIN 300 MG RE SUPP
300.0000 mg | Freq: Every day | RECTAL | Status: DC
  Administered 2014-11-09 – 2014-11-10 (×2): 300 mg via RECTAL
  Filled 2014-11-09 (×2): qty 1

## 2014-11-09 NOTE — Progress Notes (Signed)
PT Cancellation Note  Patient Details Name: Marrell Dicaprio MRN: [id number] DOB: August 20, 1925   Cancelled Treatment:    Reason Eval/Treat Not Completed: Medical issues which prohibited therapy;Patient's level of consciousness (pt with L sided facial droop which wife reports is new, SaO2 86% on RA, HR 123, pt minimally responsive, doesn't follow commands, Dr Sheran Fava notified. Will follow. )   Philomena Doheny 11/09/2014, 11:49 AM [phone]

## 2014-11-09 NOTE — Consult Note (Signed)
Consultation Note Date: 11/09/2014   Patient Name: Blake Harrison  DOB: [birth year]  MRN: [id number]  Age / Sex: 81 y.o., male   PCP: Sandi Mariscal, MD Referring Physician: Janece Canterbury, MD  Reason for Consultation: Establishing goals of care  Palliative Care Assessment and Plan Summary of Established Goals of Care and Medical Treatment Preferences  The patient is an 80 year old male, was living at home with his wife was his primary caregiver until recently when the patient was placed at [facility] a few days ago. Patient has had subacute decline for the last 3-4 months. Patient developed truncal dermatomal shingles recently and was treated at [facility]. Patient wife describes that he was in severe pain because of for shingles. Patient has had progressive dementia and debilitation.   It was recently noticed at [facility] that the patient started to slump to his left side while in a wheelchair. He also developed increasing lethargy, inability to verbally communicate with slurring of his words. He was brought to the hospital. He is admitted with concerns for metabolic encephalopathy secondary to underlying infection, possible stroke.   On further discussions by the hospital physician, it was determined that further imaging or transfer to critical care unit would not be representative of the patient's wishes. Hence CODE STATUS of DO NOT RESUSCITATE and DO NOT INTUBATE was established. It was established that care would not be escalated. A palliative consult has since been placed.  The patient is resting in bed. He is lethargic. He has a facial droop. He does not verbalize. Discussed with the patient's wife [name] in detail. Patient's [name] is also supporting the patient and his wife at this time. Patient has 2 daughters. One daughter who lives in [place] is driving down to meet with the patient. His cast patient's current medical conditions and his underlying subacute illnesses. Discussed  scope of palliative medicine services. All questions answered.  Plan: 1. Continue current mode of care as the time trial for at least the next 48 hours 2. Do not escalate care. Use of IV for medications. Patient is on IV Rocephin that is to continue. 3. Discussed initially about hospice arrangements with the patient's wife outside the room today. Discussed if the patient has acute further clinical decompensation or if he does not show meaningful signs of improvement, would recommend initiation of hospice.  Palliative will continue to follow along and help guide decision-making    Contacts/Participants in Discussion: Primary Decision Maker:  Patient, then his wife [name]    HCPOA: yes     Code Status/Advance Care Planning:  DNR  Symptom Management:   Continue to monitor for nonverbal signs of distress/discomfort. None noted currently. Continue current mode of care.  Palliative Prophylaxis: Yes.  Additional Recommendations (Limitations, Scope, Preferences):  Continue current mode of care as the time trial for at least the next 48 hours.. Patient remains on IV Rocephin. Do not escalate care. Monitor disease trajectory.  Psycho-social/Spiritual:   Support System: Yes, wife [name]. Patient has 2 daughters. One lives in [place] and the other lives in [place]. The daughter from [place] is driving today.  Desire for further Chaplaincy support:no. Patient and family being supported by their rabbi.  Prognosis: Weeks to months. Likely hospice eligible. Initiated discussions with family.  Discharge Planning:  To be determined based on disease trajectory.   Values: Continue current mode of care for now. Consider shifting to full comfort measures if no significant improvement or if further deterioration. Life limiting illness: Underlying dementia  debilitation, now with metabolic encephalopathy, possible stroke and underlying infection.      Chief Complaint/History of  Present Illness: Weakness, possible aspiration  Primary Diagnoses  Present on Admission:  . Aspiration pneumonia . PROSTATE CANCER . Post herpetic neuralgia . Hypertension . Dementia . Probable acute CVA (cerebrovascular accident)  Palliative Review of Systems: Noted I have reviewed the medical record, interviewed the patient and family, and examined the patient. The following aspects are pertinent.  Past Medical History  Diagnosis Date  . ALLERGIC RHINITIS   . Arthritis     knee  . Prostate cancer     seeds  . CVA (cerebral infarction) 2011  . Dementia   . Peripheral neuropathy   . Post herpetic neuralgia 07/2014    left upper thoracic region   History   Social History  . Marital Status: Married    Spouse Name: [name]  . Number of Children: 2  . Years of Education: doctorate    Occupational History  . retired     Engineer, water   . 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 Smoking status: Never Smoker   . Smokeless tobacco: Not on file  . Alcohol Use: 0.0 oz/week    0 Standard drinks or equivalent per week     Comment: wine once in a while   . Drug Use: No  . Sexual Activity: Not on file   Other Topics Concern  . None   Social History Narrative   Lives at home with wife    Drinks soda 1 a day   Family History  Problem Relation Age of Onset  . Heart attack Father    Scheduled Meds: . aspirin  300 mg Rectal Daily  . enoxaparin (LOVENOX) injection  30 mg Subcutaneous QHS  . [START ON 11/12/2014] ketoconazole  1 application Topical Once per day on Mon Thu  . lidocaine  2 patch Transdermal Q breakfast  . neomycin-bacitracin-polymyxin  1 application Topical Daily  . piperacillin-tazobactam (ZOSYN)  IV  2.25 g Intravenous Q8H   Continuous Infusions: . dextrose 5 % and 0.45% NaCl 50 mL/hr at 11/09/14 1210   PRN Meds:.bisacodyl, HYDROmorphone (DILAUDID) injection, LORazepam, ondansetron **OR**  ondansetron (ZOFRAN) IV Medications Prior to Admission:  Prior to Admission medications   Medication Sig Start Date End Date Taking? Authorizing Provider  aspirin EC 81 MG tablet Take 81 mg by mouth daily.   Yes Historical Provider, MD  carbamazepine (TEGRETOL) 100 MG chewable tablet Chew 100 mg by mouth 2 (two) times daily.   Yes Historical Provider, MD  carbamazepine (TEGRETOL) 200 MG tablet Take 200 mg by mouth at bedtime.  10/17/14  Yes Historical Provider, MD  citalopram (CELEXA) 20 MG tablet Take 20 mg by mouth daily.  06/16/12  Yes Historical Provider, MD  cyanocobalamin (,VITAMIN B-12,) 1000 MCG/ML injection Inject 1 mL (1,000 mcg total) into the skin every 30 (thirty) days. Instructions: to be given subq once a week for 4 weeks and then once a month for 6 months. 10/10/14  Yes Penni Bombard, MD  gabapentin (NEURONTIN) 300 MG capsule Take 3 capsules (900 mg total) by mouth 3 (three) times daily. Patient taking differently: Take 600 mg by mouth at bedtime.  10/02/14  Yes Penni Bombard, MD  hydrocortisone 2.5 % cream Apply 1 application topically daily as needed (skin irritation).  09/15/14  Yes Historical Provider, MD  ketoconazole (NIZORAL) 2 % shampoo Apply 1 application  topically 2 (two) times a week. 10/10/14  Yes Historical Provider, MD  lidocaine (LIDODERM) 5 % Place 2 patches onto the skin at bedtime. Apply 2 patches to left side of chest. On 12 hrs, off 12 hrs. 11/06/14  Yes Historical Provider, MD  lisinopril-hydrochlorothiazide (PRINZIDE,ZESTORETIC) 20-12.5 MG per tablet Take 1 tablet by mouth daily.    Yes Historical Provider, MD  Multiple Vitamin (MULTIVITAMIN) tablet Take 1 tablet by mouth daily.     Yes Historical Provider, MD  neomycin-bacitracin-polymyxin (NEOSPORIN) [phone] ointment Apply 1 application topically daily. Apply to abrasion until healed   Yes Historical Provider, MD  NUTRITIONAL SUPPLEMENTS PO Take 237 mLs by mouth 4 (four) times daily.   Yes Historical  Provider, MD  promethazine (PHENERGAN) 25 MG tablet Take 25 mg by mouth every 8 (eight) hours as needed for nausea or vomiting. Do not exceed 3 tabs /24 hrs   Yes Historical Provider, MD   Allergies  Allergen Reactions  . Shellfish Allergy     Unknown reaction   CBC:    Component Value Date/Time   WBC 15.2* 11/09/2014 0406   WBC 6.4 12/09/2010 1246   HGB 11.7* 11/09/2014 0406   HGB 12.6* 12/09/2010 1246   HCT 36.8* 11/09/2014 0406   HCT 37.6* 12/09/2010 1246   PLT 221 11/09/2014 0406   PLT 266 12/09/2010 1246   MCV 97.1 11/09/2014 0406   MCV 94.3 12/09/2010 1246   NEUTROABS 14.6* 11/08/2014 1815   NEUTROABS 5.6 12/09/2010 1246   LYMPHSABS 0.3* 11/08/2014 1815   LYMPHSABS 0.4* 12/09/2010 1246   MONOABS 0.8 11/08/2014 1815   MONOABS 0.4 12/09/2010 1246   EOSABS 0.0 11/08/2014 1815   EOSABS 0.0 12/09/2010 1246   BASOSABS 0.0 11/08/2014 1815   BASOSABS 0.0 12/09/2010 1246   Comprehensive Metabolic Panel:    Component Value Date/Time   NA 138 11/09/2014 0406   K 4.4 11/09/2014 0406   CL 103 11/09/2014 0406   CO2 27 11/09/2014 0406   BUN 70* 11/09/2014 0406   CREATININE 3.06* 11/09/2014 0406   CREATININE 1.48* 10/14/2010 1532   GLUCOSE 131* 11/09/2014 0406   CALCIUM 8.6* 11/09/2014 0406   AST 15 11/09/2014 0406   ALT 15* 11/09/2014 0406   ALKPHOS 65 11/09/2014 0406   BILITOT 0.6 11/09/2014 0406   PROT 6.2* 11/09/2014 0406   PROT 6.4 10/02/2014 1443   ALBUMIN 2.9* 11/09/2014 0406    Physical Exam: Vital Signs: BP 112/62 mmHg  Pulse 70  Temp(Src) 97.6 F (36.4 C) (Oral)  Resp 18  Ht 5' 5.5" (1.664 m)  Wt 61.236 kg (135 lb)  BMI 22.12 kg/m2  SpO2 100% SpO2: SpO2: 100 % O2 Device: O2 Device: Nasal Cannula O2 Flow Rate: O2 Flow Rate (L/min): 2 L/min Intake/output summary:  Intake/Output Summary (Last 24 hours) at 11/09/14 1644 Last data filed at 11/09/14 0930  Gross per 24 hour  Intake    120 ml  Output      0 ml  Net    120 ml   LBM: Last BM Date:  11/08/14 Baseline Weight: Weight: 61.236 kg (135 lb) Most recent weight: Weight: 61.236 kg (135 lb)  Exam Findings:  Patient with obvious left facial droop. Does not verbalize. Lungs with coarse rhonchi S1-S2 Abdomen soft Neurologic: Does not awaken does not verbalize lethargic.                       Palliative Performance Scale: 10-20 Additional Data Reviewed: Recent Labs  11/08/14  1815  11/09/14  0406  WBC  15.7*  15.2*  HGB  13.0  11.7*  PLT  234  221  NA  141  138  BUN  74*  70*  CREATININE  3.29*  3.06*     Time In: 1400 Time Out: 1455 Time Total: 55 minutes Greater than 50%  of this time was spent counseling and coordinating care related to the above assessment and plan.  Signed by: Loistine Chance, MD [place], MD  11/09/2014, 4:44 PM  Please contact Palliative Medicine Team phone at [phone] for questions and concerns.

## 2014-11-09 NOTE — Clinical Social Work Note (Signed)
Clinical Social Work Assessment   Patient Details  Name: Blake Harrison MRN: [id number] Date of Birth: 01-16-1926  Date of referral:  11/09/14               Reason for consult:  Facility Placement                Permission sought to share information with:  Family Supports, Customer service manager Permission granted to share information::  Yes, Release of Information Signed  Name::     Blake Harrison HCPOA and Blake Harrison/daughter HCPOA  Agency::  [facility]  Relationship::  wife/HCPOA and Daughter/HCPOA  Contact Information:  Wife [phone] Vaughan Basta [phone]  Housing/Transportation Living arrangements for the past 2 months:  [place], [place], [place] (recently moved into [place] on Monday 11/12/2014) Source of Information:  Power of Forensic psychologist, Adult Children, Spouse Patient Interpreter Needed:  None Criminal Activity/Legal Involvement Pertinent to Current Situation/Hospitalization:  No - Comment as needed Significant Relationships:  Adult Children, Spouse Lives with:  Facility Resident Do you feel safe going back to the place where you live?  Yes Need for family participation in patient care:  Yes (Comment)  Care giving concerns: Pt from assisted living, hoping to return however higher level of care may be needed at skilled nursing for short term rehab   Social Worker assessment / plan:  CSW met with pt, pt  Wife, and pt daughter. Patient with history of dementia and unable to fully engage in assessment. Pt wife, pt daughter, and CSW spoke outside of the room. Patient wife and daughter are both HCPOA for patient,with spouse being primary. Patient family shared that patient recently moved into [facility] on Monday. This Probation officer met pt and family in ED week prior to assist with fl2, tb skin test, and answering questions.   Pt wife and daughter would like patient to be able to return to [facility] once medically stable  however understand skilled nursing may be needed. Pt family open to facilities in [employer] CSW provided list of facilities in case skilled nursing is needed. At this time, patient plan is to return to [facility] once medically stable.   Employment status:  Retired Nurse, adult PT Recommendations:  Not assessed at this time Information / Referral to community resources:     Patient/Family's Response to care:  Pt family expressed gratitude of doctor's waiting for pt daughter to be present to assist with patient care and decision making. Patient family appreciate of csw support in helping to identify a safe discharge plan for patient once medically stable.   Patient/Family's Understanding of and Emotional Response to Diagnosis, Current Treatment, and Prognosis:  Pt family verbalized understanding on patient health care aquired penumonia, and possible weakness that may occur for patient nad need for physical therapy assessment. Patient family waiting to meet with  doctor to discuss further.   Emotional Assessment Appearance:  Appears stated age Attitude/Demeanor/Rapport:   (calm and content) Affect (typically observed):  Unable to Assess Orientation:  Oriented to Self Alcohol / Substance use:    Psych involvement (Current and /or in the community):  No (Comment)  Discharge Needs  Concerns to be addressed:  Discharge Planning Concerns (snf vs. alf) Readmission within the last 30 days:  No Current discharge risk:  None Barriers to Discharge:  No Barriers Identified   Blake Harrison, [place], LCSW 11/09/2014, 3:14 PM

## 2014-11-09 NOTE — Progress Notes (Signed)
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 He was recently admitted to [facility] senior living secondary to ongoing pain from shingles over the last 3 months and debilitation.  While there he was transported in wheelchair and started to have a slump to the left side. His wife noticed that he was unable to communicate except with slurred words and Erasm[employer] Bistline phrases. He became increasingly lethargic. On the date of admission, he was trying to drink some water and coughed/choked and developed hypoxia so he was transported to the emergency department for reevaluation.in the emergency department, he was unable to provide any history and was lethargic.  Chest x-ray was concerning for pneumonia.  Assessment/Plan  Probable stroke given left hemiparesis including facial droop, lethargy, increased difficulty speaking.differential diagnosis includes metabolic encephalopathy secondary to underlying infection, stroke. No evidence of seizure activity on exam.  Discussed transferring patient to the stepdown unit, doing further testing such as MRI of the brain, echocardiogram, carotid Dopplers, however the wife stated that she thinks that he would not want to be uncomfortable and would want comfort measures only.  We discussed CODE STATUS and he is now DO NOT RESUSCITATE/DO NOT INTUBATE. The wife asked that he be left in the same room and we use medications through his IV only.  We did not discuss IV fluids or antibiotics she is going to discuss what is going on with her daughter later this afternoon and she asked that I be part of the conversation. I will readdress IV fluids and antibiotics at that time. If he has had a stroke, he may gradually improve over the next few days. If his  altered mental status is secondary to a metabolic encephalopathy from underlying infection such as pneumonia, it may also improve over the next few days. We discussed the possibility of hospice care should he not have any improvement in his mentation. -  Every 4 hours neuro checks -  Nothing by mouth -  Oral care -  Maintenance IV fluids -  Discontinue all blood draws -  Aspiration precautions -  IV Ativan as needed for seizures or agitation -  Hold all oral medications, patient is not safe to swallow  Probable aspiration pneumonia -  Discontinue vancomycin since this medication will require vancomycin troughs and I suspect that he probably has aspiration pneumonia. He is only been at [facility] for a few days -  Continue Zosyn -  Aspiration precautions -  If he becomes more alert, we will have speech do a swallow evaluation  Postherpetic neuralgia -  Hold tegretol and gabapentin -  IV Dilaudid when necessary pain -  Continue lidocaine patch -  Physical suppositories as needed  Leukocytosis, likely secondary to aspiration pneumonia -  Awaiting urinalysis -  Continue antibiotics  Acute kidney injury, baseline creatinine near 1 and currently 3, improved slightly with IV fluids -  Continue IV fluids -  Monitor for signs of hypervolemia  Dementia without behavioral disturbance  History of stroke -  Holding all oral medications -  Daily rectal aspirin for now  Diet:  Nothing by mouth Access:  PIV IVF:  yes Proph:  Lovenox  Code Status: DO NOT RESUSCITATE Family Communication: spoke with the patient's wife Disposition Plan: we will monitor in hospital to determine if he will have improvement or  if he will need to transition to hospice care   Consultants:  none  Procedures:  Chest x-ray  Antibiotics:  Vancomycin x1  Zosyn 7/14 >   HPI/Subjective:  Patient unable to reliably follow instructions or give history. He did not indicate that he was in  pain.  Objective: Filed Vitals:   11/08/14 1847 11/08/14 2036 11/08/14 2306 11/09/14 0555  BP: 109/58 123/61 110/60   Pulse: 79 77 77 69  Temp: 97.5 F (36.4 C) 98.5 F (36.9 C) 98.6 F (37 C) 97.7 F (36.5 C)  TempSrc: Oral Oral Oral Oral  Resp: 21 22 20 20   SpO2: 97% 98% 97% 100%   No intake or output data in the 24 hours ending 11/09/14 1141 There were no vitals filed for this visit. There is no weight on file to calculate BMI.  Exam:   General:  Opened eyes and looked at me briefly with sternal rub and was able to briefly smile when asked.  Obvious left facial droop.  No acute distress  HEENT:  NCAT, MMM  Cardiovascular:  RRR, nl S1, S2 no mrg, 2+ pulses, warm extremities  Respiratory:  rhonchorous breath sounds, no wheezes, no rales heard anteriorly, no increased WOB  Abdomen:   NABS, soft, NT/ND  MSK:   increased tone and decreased bulk, no LEE. Developing contractures of all extremities  Neuro:  Lethargic, slurred unintelligible speech/noises.  Does not move feet or legs.  3 out of 5 strength upper extremities  Data Reviewed: Basic Metabolic Panel:  Recent Labs Lab 11/08/14 1815 11/09/14 0406  NA 141 138  K 4.7 4.4  CL 104 103  CO2 29 27  GLUCOSE 134* 131*  BUN 74* 70*  CREATININE 3.29* 3.06*  CALCIUM 9.1 8.6*   Liver Function Tests:  Recent Labs Lab 11/08/14 1815 11/09/14 0406  AST 21 15  ALT 16* 15*  ALKPHOS 73 65  BILITOT 0.6 0.6  PROT 6.7 6.2*  ALBUMIN 3.2* 2.9*   No results for input(s): LIPASE, AMYLASE in the last 168 hours. No results for input(s): AMMONIA in the last 168 hours. CBC:  Recent Labs Lab 11/08/14 1815 11/09/14 0406  WBC 15.7* 15.2*  NEUTROABS 14.6*  --   HGB 13.0 11.7*  HCT 40.2 36.8*  MCV 98.3 97.1  PLT 234 221    Recent Results (from the past 240 hour(s))  Urine culture     Status: None   Collection Time: 11/01/14  1:49 PM  Result Value Ref Range Status   Specimen Description URINE, RANDOM  Final    Special Requests NONE  Final   Culture   Final    NO GROWTH 2 DAYS Performed at [facility]    Report Status 11/03/2014 FINAL  Final  MRSA PCR Screening     Status: None   Collection Time: 11/09/14  4:09 AM  Result Value Ref Range Status   MRSA by PCR NEGATIVE NEGATIVE Final    Comment:        The GeneXpert MRSA Assay (FDA approved for NASAL specimens only), is one component of a comprehensive MRSA colonization surveillance program. It is not intended to diagnose MRSA infection nor to guide or monitor treatment for MRSA infections.      Studies: Dg Chest Port 1 View  11/08/2014   CLINICAL DATA:  Weakness.  EXAM: PORTABLE CHEST - 1 VIEW  COMPARISON:  January 28, 2010.  FINDINGS: Stable cardiomegaly. No pneumothorax or pleural effusion is noted. Left lung is clear. Interval  development of right perihilar opacity is noted concerning for possible pneumonia. Mild degenerative joint disease of right glenohumeral joint is noted.  IMPRESSION: Right perihilar opacity is noted concerning for possible pneumonia. Followup radiographs are recommended to ensure resolution and rule out underlying neoplasm.   Electronically Signed   By: Marijo Conception, M.D.   On: 11/08/2014 17:18    Scheduled Meds: . enoxaparin (LOVENOX) injection  30 mg Subcutaneous QHS  . [START ON 11/12/2014] ketoconazole  1 application Topical Once per day on Mon Thu  . lidocaine  2 patch Transdermal Q breakfast  . neomycin-bacitracin-polymyxin  1 application Topical Daily  . piperacillin-tazobactam (ZOSYN)  IV  2.25 g Intravenous Q8H   Continuous Infusions: . dextrose 5 % and 0.45% NaCl      Principal Problem:   HCAP (healthcare-associated pneumonia) Active Problems:   PROSTATE CANCER   Post herpetic neuralgia   Hypertension   Dementia   CVA (cerebral infarction)    Time spent: 30 min    Ethelene Closser, [facility] Pager [phone]. If 7PM-7AM, please contact night-coverage at [email or web address],  password [facility] 11/09/2014, 11:41 AM  LOS: 1 day

## 2014-11-09 NOTE — Progress Notes (Signed)
OT Cancellation Note  Patient Details Name: Blake Harrison MRN: [id number] DOB: 04/10/1926   Cancelled Treatment:    Reason Eval/Treat Not Completed: Other (comment) Pt's very lethargic and only briefly opening eyes, doesn't follow commands and note L facial droop which wife reports is new. Pt with decreased O2 sats noted and increased HR. Nursing and MD notified and both present in room when therapy left.   [place], [place] 11/09/2014, 12:18 PM

## 2014-11-10 DIAGNOSIS — N179 Acute kidney failure, unspecified: Secondary | ICD-10-CM

## 2014-11-10 DIAGNOSIS — J690 Pneumonitis due to inhalation of food and vomit: Principal | ICD-10-CM

## 2014-11-10 MED ORDER — CITALOPRAM HYDROBROMIDE 20 MG PO TABS
20.0000 mg | ORAL_TABLET | Freq: Every day | ORAL | Status: DC
  Administered 2014-11-10 – 2014-11-13 (×4): 20 mg via ORAL
  Filled 2014-11-10 (×4): qty 1

## 2014-11-10 MED ORDER — CARBAMAZEPINE 100 MG PO CHEW
100.0000 mg | CHEWABLE_TABLET | Freq: Two times a day (BID) | ORAL | Status: DC
  Administered 2014-11-10 – 2014-11-13 (×6): 100 mg via ORAL
  Filled 2014-11-10 (×7): qty 1

## 2014-11-10 MED ORDER — ACETAMINOPHEN 325 MG PO TABS
650.0000 mg | ORAL_TABLET | ORAL | Status: DC | PRN
  Administered 2014-11-10 – 2014-11-12 (×4): 650 mg via ORAL
  Filled 2014-11-10 (×5): qty 2

## 2014-11-10 MED ORDER — ASPIRIN EC 81 MG PO TBEC
81.0000 mg | DELAYED_RELEASE_TABLET | Freq: Every day | ORAL | Status: DC
  Filled 2014-11-10 (×2): qty 1

## 2014-11-10 NOTE — Progress Notes (Signed)
OT Cancellation Note  Patient Details Name: Terrace Fontanilla MRN: [id number] DOB: 03-07-1926   Cancelled Treatment:    Reason Eval/Treat Not Completed: Other (comment) No family at  Bedside  Pt resting . Will attempt to see pt when family present.  Betsy Pries 11/10/2014, 1:20 PM

## 2014-11-10 NOTE — Progress Notes (Signed)
Daily Progress Note   Patient Name: Blake Harrison       Date: 11/10/2014 DOB: 1925/06/15  Age: 81 y.o. MRN#: [id number] Attending Physician: Janece Canterbury, MD Primary Care Physician: Sandi Mariscal, MD Admit Date: 11/08/2014  Reason for Consultation/Follow-up: Establishing goals of care  Subjective:  resting in bed, no change in condition  Interval Events: Time trial of current therapies for this weekend as discussed with family on 11-09-14. No family in room currently.   Length of Stay: 2 days  Current Medications: Scheduled Meds:  . aspirin  300 mg Rectal Daily  . enoxaparin (LOVENOX) injection  30 mg Subcutaneous QHS  . [START ON 11/12/2014] ketoconazole  1 application Topical Once per day on Mon Thu  . lidocaine  2 patch Transdermal Q breakfast  . neomycin-bacitracin-polymyxin  1 application Topical Daily  . piperacillin-tazobactam (ZOSYN)  IV  2.25 g Intravenous Q8H    Continuous Infusions: . dextrose 5 % and 0.45% NaCl 50 mL/hr at 11/09/14 1210    PRN Meds: bisacodyl, HYDROmorphone (DILAUDID) injection, LORazepam, ondansetron **OR** ondansetron (ZOFRAN) IV  Palliative Performance Scale:  10%     Vital Signs: BP 118/52 mmHg  Pulse 58  Temp(Src) 98 F (36.7 C) (Oral)  Resp 20  Ht 5' 5.5" (1.664 m)  Wt 61.236 kg (135 lb)  BMI 22.12 kg/m2  SpO2 98% SpO2: SpO2: 98 % O2 Device: O2 Device: Nasal Cannula O2 Flow Rate: O2 Flow Rate (L/min): 2 L/min  Intake/output summary:  Intake/Output Summary (Last 24 hours) at 11/10/14 0926 Last data filed at 11/09/14 1843  Gross per 24 hour  Intake    120 ml  Output    100 ml  Net     20 ml   LBM:   Baseline Weight: Weight: 61.236 kg (135 lb) Most recent weight: Weight: 61.236 kg (135 lb)  Physical Exam:           Patient with left facial droop. Does not verbalize. Lungs with coarse rhonchi S1-S2 Abdomen soft Neurologic: Does not awaken does not verbalize lethargic. Additional Data Reviewed: Recent Labs     11/08/14  1815  11/09/14  0406  WBC  15.7*  15.2*  HGB  13.0  11.7*  PLT  234  221  NA  141  138  BUN  74*  70*  CREATININE  3.29*  3.06*     Problem List:  Patient Active Problem List   Diagnosis Date Noted  . AKI (acute kidney injury) 11/09/2014  . HCAP (healthcare-associated pneumonia)   . Encounter for palliative care   . Aspiration pneumonia 11/08/2014  . HAP (hospital-acquired pneumonia) 11/08/2014  . Hypertension 11/08/2014  . Dementia 11/08/2014  . Probable acute CVA (cerebrovascular accident) 11/08/2014  . Post herpetic neuralgia 09/04/2014  . PROSTATE CANCER 05/26/2007  . Seasonal and perennial allergic rhinitis 05/26/2007  . TRACHEOBRONCHITIS 05/26/2007     Palliative Care Assessment & Plan  Possible cerebrovascular accident give and left-sided weakness facial droop lethargy and difficulty speaking Metabolic encephalopathy secondary to underlying infection Aspiration pneumonia Recent history of shingles  Code Status:  DNR  Goals of Care:  Continue current mode of care, do not escalate care. Patient is on IV antibiotics. Discuss goals of care with patient's wife and daughter towards the end of this weekend for next steps. Desire for further Chaplaincy support: Patient's family is being supported by their rabbi  Symptom Management: Has when necessary IV Dilaudid available for pain. Also has when necessary IV Ativan available. 4. Palliative  Prophylaxis:  Stool Softener: Has Dulcolax rectal suppository available to be used on an as-needed basis.  5. Prognosis: < 2 weeks  5. Discharge Planning: Pending further discussions. Family has requested for current mode of care to continue over the course of the weekend.   Care plan was discussed with   Thank you for allowing the Palliative Medicine Team to assist in the care of this patient.   Time In: 0830 Time Out: 0855 Total Time 25 Prolonged Time Billed  no     Greater than 50%  of this time was spent counseling  and coordinating care related to the above assessment and plan.   Loistine Chance, MD  [phone] 11/10/2014, 9:26 AM  Please contact Palliative Medicine Team phone at [phone] for questions and concerns.

## 2014-11-10 NOTE — Progress Notes (Signed)
PT Cancellation Note  Patient Details Name: Blake Harrison MRN: [id number] DOB: [birth year]   Cancelled Treatment:    Reason Eval/Treat Not Completed: Other (comment) Family at bedside and wish for pt to rest this morning however agreeable for PT to check back at another time.   Emila Steinhauser,KATHrine E 11/10/2014, 10:27 AM Carmelia Bake, PT, DPT 11/10/2014 Pager: [phone]

## 2014-11-10 NOTE — Evaluation (Signed)
Clinical/Bedside Swallow Evaluation Patient Details  Name: Blake Harrison MRN: [id number] Date of Birth: 1926/03/02  Today's Date: 11/10/2014 Time: SLP Start Time (ACUTE ONLY): 1501 SLP Stop Time (ACUTE ONLY): 1520 SLP Time Calculation (min) (ACUTE ONLY): 19 min  Past Medical History:  Past Medical History  Diagnosis Date  . ALLERGIC RHINITIS   . Arthritis     knee  . Prostate cancer     seeds  . CVA (cerebral infarction) 2011  . Dementia   . Peripheral neuropathy   . Post herpetic neuralgia 07/2014    left upper thoracic region   Past Surgical History:  Past Surgical History  Procedure Laterality Date  . 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 He was recently admitted to [facility] senior living secondary to ongoing pain from shingles over the last 3 months and debilitation. While there he was transported in wheelchair and started to have a slump to the left side. His wife noticed that he was unable to communicate except with slurred words and short phrases. He became increasingly lethargic. On the date of admission, he was trying to drink some water and coughed/choked and developed hypoxia so he was transported to the emergency department for reevaluation.in the emergency department, he was unable to provide any history and was lethargic. Chest x-ray was concerning for pneumonia.   Assessment / Plan / Recommendation Clinical Impression   Pt exhibited multiple swallows with sips of thin liquids and eventual wet vocal quality with throat clear x1 d/t probable pharyngeal residue (cannot be determined without objective testing; i.e.: MBS); oral involvement with left facial weakness (unsure if new onset d/t old CVA with left facial weakness; no MRI/CT completed d/t family preference) with functional implications being decreased oral  transit/manipulation and oral residue. Palliative care has been initiated at this time, but SLP feels with recent hx of coughing with liquids and oropharyngeal dysphagia present, as well as cognitive status,  pt should remain NPO d/t moderate risk for aspiration if family and MD are in agreement. ST will f/u to determine safest diet/liquids as pt is able to tolerate po feeding and/or instrumental testing if pt can tolerate.    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 He was recently admitted to [facility] senior living secondary to ongoing pain from shingles over the last 3 months and debilitation. While there he was transported in wheelchair and started to have a slump to the left side. His wife noticed that he was unable to communicate except with slurred words and short phrases. He became increasingly lethargic. On the date of admission, he was trying to drink some water and coughed/choked and developed hypoxia so he was transported to the emergency department for reevaluation.in the emergency department, he was unable  to provide any history and was lethargic. Chest x-ray was concerning for pneumonia. Type of Study: Bedside swallow evaluation Previous Swallow Assessment: n/a Diet Prior to this Study: NPO Temperature Spikes Noted: No Respiratory Status: Room  air History of Recent Intubation: No Behavior/Cognition: Alert;Confused Oral Cavity - Dentition: Adequate natural dentition/normal for age Self-Feeding Abilities: Needs assist;Needs set up Patient Positioning: Upright in bed Baseline Vocal Quality: Low vocal intensity Volitional Cough: Weak Volitional Swallow: Able to elicit    Oral/Motor/Sensory Function Overall Oral Motor/Sensory Function: Impaired Labial ROM: Reduced left Labial Symmetry: Abnormal symmetry left Labial Strength: Reduced Labial Sensation: Reduced Lingual ROM: Within Functional Limits;Reduced left Lingual Symmetry: Abnormal symmetry left Lingual Strength: Reduced Facial ROM: Reduced left Facial Symmetry: Left droop Facial Strength: Reduced Velum: Impaired left   Ice Chips Ice chips: Not tested   Thin Liquid Thin Liquid: Impaired Presentation: Cup Oral Phase Impairments: Impaired anterior to posterior transit Oral Phase Functional Implications: Prolonged oral transit Pharyngeal  Phase Impairments: Suspected delayed Swallow;Multiple swallows    Nectar Thick Nectar Thick Liquid: Not tested   Honey Thick Honey Thick Liquid: Not tested   Puree Puree: Impaired Presentation: Spoon Oral Phase Impairments: Reduced lingual movement/coordination;Impaired anterior to posterior transit Oral Phase Functional Implications: Prolonged oral transit;Oral holding Pharyngeal Phase Impairments: Suspected delayed Swallow;Multiple swallows   Solid       Solid: Impaired Presentation: Spoon Oral Phase Impairments: Reduced lingual movement/coordination;Impaired anterior to posterior transit Oral Phase Functional Implications: Left lateral sulci pocketing;Oral residue Pharyngeal Phase Impairments: Suspected delayed Swallow;Multiple swallows       Micheale Schlack,PAT, M.S., CCC-SLP 11/10/2014,3:25 PM

## 2014-11-10 NOTE — Progress Notes (Addendum)
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 He was recently admitted to [facility] senior living secondary to ongoing pain from shingles over the last 3 months and debilitation.  While there he was transported in wheelchair and started to have a slump to the left side. His wife noticed that he was unable to communicate except with slurred words and Vinh Sachs phrases. He became increasingly lethargic. On the date of admission, he was trying to drink some water and coughed/choked and developed hypoxia so he was transported to the emergency department for reevaluation.in the emergency department, he was unable to provide any history and was lethargic.  Chest x-ray was concerning for pneumonia.  7/15:  Patient found almost unresponsive by PT/OT with left facial droop 7/15:  Palliative care consulted, limited interventions only at this time 7/16:  Awake and improved with more articulate speech, persistent left facial droop, diet advanced while awaiting speech therapy assessment  Assessment/Plan  Probable stroke given left hemiparesis including facial droop, lethargy, increased difficulty speaking. - advance diet to dysphagia 1 with nectar pending speech evaluation -  Resume oral aspirin -  Oral care -  Maintenance IV fluids -  Aspiration precautions -  IV Ativan as needed for seizures or agitation  Probable aspiration pneumonia -  Discontinue vancomycin since this medication will require vancomycin troughs and I suspect that he probably has aspiration pneumonia. He is only been at [facility] for a few days -  Continue Zosyn -  Aspiration precautions  Postherpetic neuralgia -  Resume tegretol  -  Continue to hold gabapentin -  D/c IV dilaudid and start prn  tylenol -  Continue lidocaine patch  Leukocytosis, likely secondary to aspiration pneumonia -  Awaiting urinalysis:  Hematuria and a few WBC 11-20, but only a few bacteria -  Continue antibiotics  Acute kidney injury, baseline creatinine near 1 and currently 3, improved slightly with IV fluids -  Continue IV fluids -  Monitor for signs of hypervolemia  Dementia without behavioral disturbance  History of stroke -  Resume asa 81mg  daily  Diet:  Dysphagia 1 with nectar Access:  PIV IVF:  yes Proph:  Lovenox  Code Status: DO NOT RESUSCITATE Family Communication: spoke with the patient's wife by phone Disposition Plan:  Awaiting speech eval, anticipate discharge to SNF on Monday  Consultants:  none  Procedures:  Chest x-ray  Antibiotics:  Vancomycin x1  Zosyn 7/14 >   HPI/Subjective:   States the he has pain on his bottom.  States he is sitting on a pad (accurate).  Denies chest pain, difficulty breathing, nausea  Objective: Filed Vitals:   11/09/14 1200 11/09/14 1459 11/09/14 2332 11/10/14 0513  BP:  112/62 100/45 118/52  Pulse:  70 63 58  Temp:  97.6 F (36.4 C) 98.3 F (36.8 C) 98 F (36.7 C)  TempSrc:  Oral Oral Oral  Resp:  18 20 20   Height: 5' 5.5" (1.664 m)     Weight: 61.236 kg (135 lb)     SpO2:  100% 97% 98%    Intake/Output Summary (Last 24 hours) at 11/10/14 1455 Last data filed at 11/10/14 0900  Gross per 24 hour  Intake      0 ml  Output    200 ml  Net   -200 ml  Filed Weights   11/09/14 1200  Weight: 61.236 kg (135 lb)   Body mass index is 22.12 kg/(m^2).  Exam:   General:   Adult male, awake, alert, making eye contact and able to communicate some today. His words are better understood and articulated although he is clearly confused. He does not know where he is. He tells me he has pain in his bottom. He states that he has pain in his bottom because he is on a pad which is accurate that he is unable to state  More specifically what  is bothersome.  HEENT:  NCAT, MMM  Cardiovascular:  RRR, nl S1, S2 no mrg, 2+ pulses, warm extremities  Respiratory:  rhonchorous breath sounds, no wheezes, no rales heard anteriorly, no increased WOB  Abdomen:   NABS, soft, NT/ND  MSK:   increased tone and decreased bulk, no LEE. Developing contractures of all extremities  Neuro:  Grossly moves arms but has some evidence of early contractures.  3/5 strength legs.  Data Reviewed: Basic Metabolic Panel:  Recent Labs Lab 11/08/14 1815 11/09/14 0406  NA 141 138  K 4.7 4.4  CL 104 103  CO2 29 27  GLUCOSE 134* 131*  BUN 74* 70*  CREATININE 3.29* 3.06*  CALCIUM 9.1 8.6*   Liver Function Tests:  Recent Labs Lab 11/08/14 1815 11/09/14 0406  AST 21 15  ALT 16* 15*  ALKPHOS 73 65  BILITOT 0.6 0.6  PROT 6.7 6.2*  ALBUMIN 3.2* 2.9*   No results for input(s): LIPASE, AMYLASE in the last 168 hours. No results for input(s): AMMONIA in the last 168 hours. CBC:  Recent Labs Lab 11/08/14 1815 11/09/14 0406  WBC 15.7* 15.2*  NEUTROABS 14.6*  --   HGB 13.0 11.7*  HCT 40.2 36.8*  MCV 98.3 97.1  PLT 234 221    Recent Results (from the past 240 hour(s))  Urine culture     Status: None   Collection Time: 11/01/14  1:49 PM  Result Value Ref Range Status   Specimen Description URINE, RANDOM  Final   Special Requests NONE  Final   Culture   Final    NO GROWTH 2 DAYS Performed at [facility]    Report Status 11/03/2014 FINAL  Final  Culture, blood (routine x 2) Call MD if unable to obtain prior to antibiotics being given     Status: None (Preliminary result)   Collection Time: 11/08/14 11:55 PM  Result Value Ref Range Status   Specimen Description BLOOD LEFT ANTECUBITAL  Final   Special Requests BOTTLES DRAWN AEROBIC AND ANAEROBIC 10CC  Final   Culture   Final    NO GROWTH 1 DAY Performed at [facility]    Report Status PENDING  Incomplete  Culture, blood (routine x 2) Call MD if unable to obtain  prior to antibiotics being given     Status: None (Preliminary result)   Collection Time: 11/09/14 12:12 AM  Result Value Ref Range Status   Specimen Description BLOOD L ARM  Final   Special Requests BOTTLES DRAWN AEROBIC AND ANAEROBIC 5ML  Final   Culture   Final    NO GROWTH 1 DAY Performed at [facility]    Report Status PENDING  Incomplete  MRSA PCR Screening     Status: None   Collection Time: 11/09/14  4:09 AM  Result Value Ref Range Status   MRSA by PCR NEGATIVE NEGATIVE Final    Comment:  The GeneXpert MRSA Assay (FDA approved for NASAL specimens only), is one component of a comprehensive MRSA colonization surveillance program. It is not intended to diagnose MRSA infection nor to guide or monitor treatment for MRSA infections.      Studies: Dg Chest Port 1 View  11/08/2014   CLINICAL DATA:  Weakness.  EXAM: PORTABLE CHEST - 1 VIEW  COMPARISON:  January 28, 2010.  FINDINGS: Stable cardiomegaly. No pneumothorax or pleural effusion is noted. Left lung is clear. Interval development of right perihilar opacity is noted concerning for possible pneumonia. Mild degenerative joint disease of right glenohumeral joint is noted.  IMPRESSION: Right perihilar opacity is noted concerning for possible pneumonia. Followup radiographs are recommended to ensure resolution and rule out underlying neoplasm.   Electronically Signed   By: Marijo Conception, M.D.   On: 11/08/2014 17:18    Scheduled Meds: . aspirin  300 mg Rectal Daily  . enoxaparin (LOVENOX) injection  30 mg Subcutaneous QHS  . [START ON 11/12/2014] ketoconazole  1 application Topical Once per day on Mon Thu  . lidocaine  2 patch Transdermal Q breakfast  . neomycin-bacitracin-polymyxin  1 application Topical Daily  . piperacillin-tazobactam (ZOSYN)  IV  2.25 g Intravenous Q8H   Continuous Infusions: . dextrose 5 % and 0.45% NaCl 50 mL/hr at 11/09/14 1210    Principal Problem:   Aspiration pneumonia Active  Problems:   PROSTATE CANCER   Post herpetic neuralgia   Hypertension   Dementia   Probable acute CVA (cerebrovascular accident)   AKI (acute kidney injury)   HCAP (healthcare-associated pneumonia)   Encounter for palliative care    Time spent: 30 min    Johnatha Zeidman, [facility] Pager [phone]. If 7PM-7AM, please contact night-coverage at [email or web address], password [facility] 11/10/2014, 2:55 PM  LOS: 2 days

## 2014-11-10 NOTE — Evaluation (Signed)
Physical Therapy Evaluation Patient Details Name: Blake Harrison MRN: [id number] DOB: June 15, 1925 Today's Date: 11/10/2014   History of Present Illness  Pt is an 81 year old male admitted for probable stroke given left hemiparesis including facial droop, lethargy, increased difficulty speaking as well as probable aspiration pneumonia with hx of CVA, dementia, peripheral neuropathy and post herpetic neuralgia of left upper thoracic region  Clinical Impression  Pt admitted with above diagnosis. Pt currently with functional limitations due to the deficits listed below (see PT Problem List).  Pt will benefit from skilled PT to increase their independence and safety with mobility to allow discharge to the venue listed below.  Evaluation limited to bed only as pt with increased pain in L upper thoracic region with daughter reporting postshingles pain, and pt guarding and resisting assist for mobility.  Pt assisted with repositioning to comfort and daughter called RN for pain meds.  Daughter would like PT to continue to assist pt with mobility during hospital stay.  Uncertain of d/c plan at this time, daughter reports possible d/c to SNF for rehab vs back to ALF with hospice.     Follow Up Recommendations SNF;Supervision/Assistance - 24 hour    Equipment Recommendations  None recommended by PT    Recommendations for Other Services       Precautions / Restrictions Precautions Precautions: Fall Precaution Comments: previous CVA and probable recent CVA with L sided hemiparesis      Mobility  Bed Mobility Overal bed mobility: Needs Assistance Bed Mobility: Rolling Rolling: Total assist         General bed mobility comments: attempted rolling to R side due to L thoracic region pain multiple times however pt resistant due to pain and guarding, would likely need +2 assist to sit upright, deferred further mobility due to pain and assisted with repositioning pt to comfort  Transfers                    Ambulation/Gait                Stairs            Wheelchair Mobility    Modified Rankin (Stroke Patients Only)       Balance                                             Pertinent Vitals/Pain Pain Assessment: Faces Faces Pain Scale: Hurts whole lot Pain Location: left upper thoracic region Pain Descriptors / Indicators: Grimacing;Guarding Pain Intervention(s): Repositioned;Monitored during session;Limited activity within patient's tolerance;Patient requesting pain meds-RN notified    Home Living Family/patient expects to be discharged to:: Skilled nursing facility                 Additional Comments: from ALF, family plans for rehab or back to ALF with hopsice, uncertain at this time    Prior Function Level of Independence: Needs assistance   Gait / Transfers Assistance Needed: was ambulatory approx a month ago per family, recent decline since shingles, mostly bed bound last 3 weeks           Hand Dominance        Extremity/Trunk Assessment   Upper Extremity Assessment: LUE deficits/detail       LUE Deficits / Details: pt would only keep L arm wrapped across stomach, was able to reposition with pillow between  arm and ribcage with pt assisting L hand with R UE   Lower Extremity Assessment: Generalized weakness;LLE deficits/detail   LLE Deficits / Details: increased guarding of L side making testing and mobility difficult (also possible recent CVA effecting L side)     Communication   Communication: HOH  Cognition Arousal/Alertness: Awake/alert Behavior During Therapy: WFL for tasks assessed/performed Overall Cognitive Status: History of cognitive impairments - at baseline (family reports cognition and speech much improved today, hx dementia)                      General Comments      Exercises        Assessment/Plan    PT Assessment Patient needs continued PT services  PT Diagnosis  Difficulty walking;Generalized weakness   PT Problem List Decreased strength;Decreased activity tolerance;Decreased mobility;Decreased balance;Decreased knowledge of use of DME;Pain  PT Treatment Interventions DME instruction;Gait training;Functional mobility training;Patient/family education;Therapeutic activities;Wheelchair mobility training;Therapeutic exercise;Balance training   PT Goals (Current goals can be found in the Care Plan section) Acute Rehab PT Goals PT Goal Formulation: With patient/family Time For Goal Achievement: 11/17/14 Potential to Achieve Goals: Fair    Frequency Min 2X/week   Barriers to discharge        Co-evaluation               End of Session   Activity Tolerance: Patient limited by pain Patient left: in bed;with call bell/phone within reach;with family/visitor present Nurse Communication: Patient requests pain meds         Time: 7619-5093 PT Time Calculation (min) (ACUTE ONLY): 19 min   Charges:   PT Evaluation $Initial PT Evaluation Tier I: 1 Procedure     PT G Codes:        Vestal Crandall,KATHrine E 11/10/2014, 4:09 PM Carmelia Bake, PT, DPT 11/10/2014 Pager: [phone]

## 2014-11-11 DIAGNOSIS — K921 Melena: Secondary | ICD-10-CM | POA: Insufficient documentation

## 2014-11-11 LAB — CBC
HCT: 36.3 % — ABNORMAL LOW (ref 39.0–52.0)
Hemoglobin: 11.6 g/dL — ABNORMAL LOW (ref 13.0–17.0)
MCH: 31.5 pg (ref 26.0–34.0)
MCHC: 32 g/dL (ref 30.0–36.0)
MCV: 98.6 fL (ref 78.0–100.0)
Platelets: 252 10*3/uL (ref 150–400)
RBC: 3.68 MIL/uL — AB (ref 4.22–5.81)
RDW: 13.4 % (ref 11.5–15.5)
WBC: 7.6 10*3/uL (ref 4.0–10.5)

## 2014-11-11 LAB — BASIC METABOLIC PANEL
Anion gap: 7 (ref 5–15)
BUN: 51 mg/dL — ABNORMAL HIGH (ref 6–20)
CO2: 25 mmol/L (ref 22–32)
Calcium: 8.2 mg/dL — ABNORMAL LOW (ref 8.9–10.3)
Chloride: 105 mmol/L (ref 101–111)
Creatinine, Ser: 2.12 mg/dL — ABNORMAL HIGH (ref 0.61–1.24)
GFR calc Af Amer: 31 mL/min — ABNORMAL LOW (ref >60)
GFR, EST NON AFRICAN AMERICAN: 26 mL/min — AB (ref >60)
GLUCOSE: 118 mg/dL — AB (ref 65–99)
POTASSIUM: 3.6 mmol/L (ref 3.5–5.1)
Sodium: 137 mmol/L (ref 135–145)

## 2014-11-11 MED ORDER — PIPERACILLIN-TAZOBACTAM IN DEX 2-0.25 GM/50ML IV SOLN
2.2500 g | Freq: Four times a day (QID) | INTRAVENOUS | Status: DC
  Administered 2014-11-11 – 2014-11-12 (×4): 2.25 g via INTRAVENOUS
  Filled 2014-11-11 (×5): qty 50

## 2014-11-11 MED ORDER — STARCH (THICKENING) PO POWD
ORAL | Status: DC | PRN
  Filled 2014-11-11: qty 227

## 2014-11-11 MED ORDER — LORAZEPAM 2 MG/ML IJ SOLN
0.5000 mg | Freq: Once | INTRAMUSCULAR | Status: AC
  Administered 2014-11-11: 0.5 mg via INTRAVENOUS
  Filled 2014-11-11: qty 1

## 2014-11-11 NOTE — Progress Notes (Signed)
ANTIBIOTIC CONSULT NOTE  Pharmacy Consult for zosyn Indication: pneumonia  Allergies  Allergen Reactions  . Shellfish Allergy     Unknown reaction    Patient Measurements: weight 61 kg, height 65 inches Height: 5' 5.5" (166.4 cm) Weight: 135 lb (61.236 kg) IBW/kg (Calculated) : 62.65  Vital Signs: Temp: 97.8 F (36.6 C) (07/17 0457) Temp Source: Oral (07/17 0457) BP: 90/54 mmHg (07/17 0457) Pulse Rate: 61 (07/17 0457) Intake/Output from previous day: 07/16 0701 - 07/17 0700 In: 1541.7 [I.V.:1541.7] Out: 700 [Urine:700] Intake/Output from this shift:    Labs:  Recent Labs  11/08/14 1815 11/09/14 0406 11/11/14 0411  WBC 15.7* 15.2* 7.6  HGB 13.0 11.7* 11.6*  PLT 234 221 252  CREATININE 3.29* 3.06* 2.12*   Estimated Creatinine Clearance: 21.3 mL/min (by C-G formula based on Cr of 2.12). No results for input(s): VANCOTROUGH, VANCOPEAK, VANCORANDOM, GENTTROUGH, GENTPEAK, GENTRANDOM, TOBRATROUGH, TOBRAPEAK, TOBRARND, AMIKACINPEAK, AMIKACINTROU, AMIKACIN in the last 72 hours.   Microbiology: Recent Results (from the past 720 hour(s))  Urine culture     Status: None   Collection Time: 11/01/14  1:49 PM  Result Value Ref Range Status   Specimen Description URINE, RANDOM  Final   Special Requests NONE  Final   Culture   Final    NO GROWTH 2 DAYS Performed at [facility]    Report Status 11/03/2014 FINAL  Final  Culture, blood (routine x 2) Call MD if unable to obtain prior to antibiotics being given     Status: None (Preliminary result)   Collection Time: 11/08/14 11:55 PM  Result Value Ref Range Status   Specimen Description BLOOD LEFT ANTECUBITAL  Final   Special Requests BOTTLES DRAWN AEROBIC AND ANAEROBIC 10CC  Final   Culture   Final    NO GROWTH 1 DAY Performed at [facility]    Report Status PENDING  Incomplete  Culture, blood (routine x 2) Call MD if unable to obtain prior to antibiotics being given     Status: None (Preliminary result)   Collection Time: 11/09/14 12:12 AM  Result Value Ref Range Status   Specimen Description BLOOD L ARM  Final   Special Requests BOTTLES DRAWN AEROBIC AND ANAEROBIC 5ML  Final   Culture   Final    NO GROWTH 1 DAY Performed at [facility]    Report Status PENDING  Incomplete  MRSA PCR Screening     Status: None   Collection Time: 11/09/14  4:09 AM  Result Value Ref Range Status   MRSA by PCR NEGATIVE NEGATIVE Final    Comment:        The GeneXpert MRSA Assay (FDA approved for NASAL specimens only), is one component of a comprehensive MRSA colonization surveillance program. It is not intended to diagnose MRSA infection nor to guide or monitor treatment for MRSA infections.     Medical History: Past Medical History  Diagnosis Date  . ALLERGIC RHINITIS   . Arthritis     knee  . Prostate cancer     seeds  . CVA (cerebral infarction) 2011  . Dementia   . Peripheral neuropathy   . Post herpetic neuralgia 07/2014    left upper thoracic region    Medications:  See med rec   Assessment: Patient's an 81 y.o M who presents to the ED from [facility] senior living with c/o hematuria and lethargy. CXR showed R perihilar opacity.  Pharmacy dosing zosyn for pneumonia  7/14 >> Vanc x1 @ 2200 >>  7/15 7/14 >> Zosyn >>  7/15 blood: NGTD Strep Ag: neg Legionella Ag: pending  WBC to WNL Renal - SCr improving afebrile  Goal of Therapy:  Dose per patient specific parameters  Plan:  Day #4 zosyn -  Change Zosyn 2.25 gm IV q6h and if renal function continues to improve then change change to the 3.375gm IV q8h over 4h infusion. - f/u renal function  Doreene Eland, PharmD, BCPS.   Pager: [phone] 11/11/2014,10:15 AM

## 2014-11-11 NOTE — Discharge Summary (Signed)
Physician Discharge Summary  Blake Harrison BPZ:[id number] DOB: [birth year] DOA: 11/08/2014  PCP: Sandi Mariscal, MD  Admit date: 11/08/2014 Discharge date: 11/13/2014  Recommendations for Outpatient Follow-up:  1. Transfer to inpatient hospice for ongoing care  Discharge Diagnoses:  Principal Problem:   Aspiration pneumonia Active Problems:   PROSTATE CANCER   Post herpetic neuralgia   Hypertension   Dementia   Probable acute CVA (cerebrovascular accident)   AKI (acute kidney injury)   HCAP (healthcare-associated pneumonia)   Encounter for palliative care   Melena   Discharge Condition: stable, fair  Diet recommendation: dysphagia 3 with thin liquids, whole meds with puree, follow with liquids, slow rate, small sips and bites, follow solids with liquid.  Stay upright after meals.  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 He was recently admitted to [facility] senior living secondary to ongoing pain from shingles over the last 3 months and debilitation. While there he was transported in wheelchair and started to have a slump to the left side. His wife noticed that he was unable to communicate except with slurred words and Ellory Khurana phrases. He became increasingly lethargic. On the date of admission, he was trying to drink some water and coughed/choked and developed hypoxia so he was transported to the emergency department for reevaluation.in the emergency department, he was unable to provide any history and was lethargic. Chest x-ray was concerning for pneumonia.  7/15: Patient found almost unresponsive by PT/OT with left facial droop 7/15: Palliative care consulted, limited interventions only at this time 7/16: Awake  and improved with more articulate speech, persistent left facial droop, diet advanced while awaiting speech therapy assessment 7/17:  Developed melena and had 12 blood stools 7/18:  Bleeding slowing some, but eating minimally (165mL in last 24 hours)  Hospital Course:   Probable stroke given left hemiparesis including facial droop, lethargy, increased difficulty speaking.  He had clinical improvement over several days.  He worked with speech therapy who initially recommended NPO, however, he was later able to complete a MBS and was cleared for a dysphagia 3 diet with thin liquids.  He remains essentially bedbound.    Melena with ongoing blood loss and bloody stools.  No further work up at this time.  Transfer to [facility] for ongoing symptom management.    Probable aspiration pneumonia.  He was started initially on vancomycin and zosyn, however, his vancomycin was discontinued after 24 hours since there was higher suspicion for aspiration than for HCAP.  He continued zosyn for a total of 6 days with excellent improvement.  He was stable on room air at the time of discharge.  No further antibiotics after transition to [facility].    Postherpetic neuralgia.  Initially his medications were held due to obtundation, however, he was able to resume tegretol and gabapentin (renally dosed).  He also started low dose morphine and was given a lidocaine patch which helped.    Leukocytosis, likely secondary to aspiration pneumonia and resolved with antibiotics.    Acute kidney injury, baseline creatinine near 1 and was initially 3 but trended down with IVF. Due to transition to comfort measures, no further labs were drawn.    Dementia, likely vascular dementia, without behavioral disturbance and probable  depression.  Focusing on symptom management.  Continue celexa.  History of stroke, holding aspirin due to melena.   Consultants:  Palliative care  Procedures:  Chest x-ray  Antibiotics:  Vancomycin  x1  Zosyn 7/14 > 7/19  Discharge Exam: Filed Vitals:   11/13/14 0537  BP: 134/72  Pulse: 75  Temp: 97.5 F (36.4 C)  Resp: 18   Filed Vitals:   11/12/14 0617 11/12/14 1450 11/12/14 2112 11/13/14 0537  BP: 143/69 141/50 130/64 134/72  Pulse: 64 62 86 75  Temp: 98.2 F (36.8 C) 98.3 F (36.8 C) 97.9 F (36.6 C) 97.5 F (36.4 C)  TempSrc: Oral Oral Oral Oral  Resp: 20 18 18 18   Height:      Weight:      SpO2: 96% 96% 92% 91%     General: Adult male, awake, alert, making eye contact and able to communicate today. His words are better understood and articulated although he is clearly confused. He does not know where he is.   HEENT: NCAT, MMM  Cardiovascular: RRR, nl S1, S2 no mrg, 2+ pulses, warm extremities  Respiratory: rhonchorous breath sounds, no wheezes, no rales heard anteriorly, no increased WOB  Abdomen: NABS, soft, NT/ND  MSK: increased tone and decreased bulk, no LEE. Developing contractures of hands  Neuro: Grossly moves arms. 3/5 strength legs. Curled in fetal position on left side  Discharge Instructions      Discharge Instructions    Call MD for:  difficulty breathing, headache or visual disturbances    Complete by:  As directed      Call MD for:  hives    Complete by:  As directed      Call MD for:  persistant nausea and vomiting    Complete by:  As directed      Call MD for:  severe uncontrolled pain    Complete by:  As directed      Call MD for:  temperature >100.4    Complete by:  As directed      Increase activity slowly    Complete by:  As directed             Medication List    STOP taking these medications        aspirin EC 81 MG tablet     cyanocobalamin 1000 MCG/ML injection  Commonly known as:  (VITAMIN B-12)     hydrocortisone 2.5 % cream     lisinopril-hydrochlorothiazide 20-12.5 MG per tablet  Commonly known as:  PRINZIDE,ZESTORETIC     multivitamin tablet     NUTRITIONAL SUPPLEMENTS PO      promethazine 25 MG tablet  Commonly known as:  PHENERGAN      TAKE these medications        acetaminophen 325 MG tablet  Commonly known as:  TYLENOL  Take 2 tablets (650 mg total) by mouth every 4 (four) hours as needed for mild pain, moderate pain, fever or headache.     carbamazepine 100 MG chewable tablet  Commonly known as:  TEGRETOL  Chew 100 mg by mouth 2 (two) times daily.     citalopram 20 MG tablet  Commonly known as:  CELEXA  Take 20 mg by mouth daily.     gabapentin 300 MG capsule  Commonly known as:  NEURONTIN  Take 1 capsule (300 mg total) by mouth 2 (two) times daily.     ketoconazole 2 % shampoo  Commonly known as:  NIZORAL  Apply 1  application topically 2 (two) times a week.     lidocaine 5 %  Commonly known as:  LIDODERM  Place 2 patches onto the skin at bedtime. Apply 2 patches to left side of back. On 12 hrs, off 12 hrs.     LORazepam 0.5 MG tablet  Commonly known as:  ATIVAN  Take 1 tablet (0.5 mg total) by mouth every 4 (four) hours as needed for anxiety or sedation.     morphine CONCENTRATE 10 MG/0.5ML Soln concentrated solution  Take 0.25 mLs (5 mg total) by mouth every hour as needed for moderate pain or severe pain.     neomycin-bacitracin-polymyxin [phone] ointment  Apply 1 application topically daily. Apply to abrasion until healed       Follow-up Information    Schedule an appointment as soon as possible for a visit with Sandi Mariscal, MD.   Specialty:  Internal Medicine   Why:  As needed   Contact information:   [address] [address] [phone]        The results of significant diagnostics from this hospitalization (including imaging, microbiology, ancillary and laboratory) are listed below for reference.    Significant Diagnostic Studies: Dg Chest 1 View  11/01/2014   CLINICAL DATA:  BILATERAL back pain beginning last night, history prostate cancer, weakness, multiple falls  EXAM: CHEST  1 VIEW  COMPARISON:  Due  to patient condition exam was performed in a LEFT lateral decubitus position. Comparison made to 01/28/2010.  FINDINGS: Normal heart size and mediastinal contours.  Minimal dependent atelectasis LEFT lung base laterally potentially related to decubitus positioning.  Lungs otherwise clear.  No pleural effusion or pneumothorax.  Bones demineralized.  IMPRESSION: Minimal LEFT base atelectasis, potentially positional.   Electronically Signed   By: Lavonia Dana M.D.   On: 11/01/2014 13:35   Dg Forearm Left  10/19/2014   CLINICAL DATA:  Left forearm pain  EXAM: LEFT FOREARM - 2 VIEW  COMPARISON:  03/02/2010  FINDINGS: Bony demineralization. Slightly bowed appearance of the radius is chronic and perhaps due to an old injury, but no acute bony findings are observed. Mild spurring of the coronoid process of the ulna.  IMPRESSION: 1. No acute bony findings. Slight bowing of the radius may be due to an old injury, and appears chronic.   Electronically Signed   By: Van Clines M.D.   On: 10/19/2014 18:19   Ct Head Wo Contrast  11/01/2014   CLINICAL DATA:  Falls.  Weakness.  EXAM: CT HEAD WITHOUT CONTRAST  TECHNIQUE: Contiguous axial images were obtained from the base of the skull through the vertex without intravenous contrast.  COMPARISON:  CT head 10/19/2014  FINDINGS: Moderate to advanced atrophy, stable from the prior study. Chronic microvascular ischemic change throughout the white matter. Chronic infarct right basal ganglia unchanged.  Negative for acute infarct.  Negative for hemorrhage or mass.  Calvarium intact.  IMPRESSION: Moderate to advanced atrophy. Moderate chronic microvascular ischemia. No acute abnormality.   Electronically Signed   By: Franchot Gallo M.D.   On: 11/01/2014 13:33   Ct Head Wo Contrast  10/19/2014   CLINICAL DATA:  Shingles on left side and back. Complains of fall. Weakness.  EXAM: CT HEAD WITHOUT CONTRAST  TECHNIQUE: Contiguous axial images were obtained from the base of the skull  through the vertex without intravenous contrast.  COMPARISON:  01/28/2010  FINDINGS: There is prominence of the sulci and ventricles consistent with brain atrophy. There is low attenuation  throughout the subcortical and periventricular white matter consistent with chronic microvascular disease. Old right alignment lacunar infarct is noted. There is no evidence for acute brain infarct, hemorrhage or mass. The paranasal sinuses are clear. The mastoid air cells are also clear. The calvarium is intact.  IMPRESSION: 1. No acute intracranial abnormalities. 2. Chronic microvascular disease and brain atrophy.   Electronically Signed   By: Kerby Moors M.D.   On: 10/19/2014 20:00   Dg Chest Port 1 View  11/08/2014   CLINICAL DATA:  Weakness.  EXAM: PORTABLE CHEST - 1 VIEW  COMPARISON:  January 28, 2010.  FINDINGS: Stable cardiomegaly. No pneumothorax or pleural effusion is noted. Left lung is clear. Interval development of right perihilar opacity is noted concerning for possible pneumonia. Mild degenerative joint disease of right glenohumeral joint is noted.  IMPRESSION: Right perihilar opacity is noted concerning for possible pneumonia. Followup radiographs are recommended to ensure resolution and rule out underlying neoplasm.   Electronically Signed   By: Marijo Conception, M.D.   On: 11/08/2014 17:18   Dg Hand Complete Left  10/19/2014   CLINICAL DATA:  Fall.  EXAM: LEFT HAND - COMPLETE 3+ VIEW  COMPARISON:  None.  FINDINGS: There is diffuse decreased bone mineralization. There are degenerative changes over the radiocarpal joint and distal radioulnar joint. Mild degenerate changes over the interphalangeal joints and MCP joints. No acute fracture or dislocation.  IMPRESSION: No acute findings.  Mild degenerative changes over the wrist and hand.   Electronically Signed   By: Marin Olp M.D.   On: 10/19/2014 18:19   Dg Swallowing Func-speech Pathology  11/12/2014    Objective Swallowing Evaluation:    Patient  Details  Name: Blake Harrison MRN: [id number] Date of Birth: [birth year]  Today's Date: 11/12/2014 Time: SLP Start Time (ACUTE ONLY): 0915-SLP Stop Time (ACUTE ONLY): 0930 SLP Time Calculation (min) (ACUTE ONLY): 15 min  Past Medical History:  Past Medical History  Diagnosis Date  . ALLERGIC RHINITIS   . Arthritis     knee  . Prostate cancer     seeds  . CVA (cerebral infarction) 2011  . Dementia   . Peripheral neuropathy   . Post herpetic neuralgia 07/2014    left upper thoracic region   Past Surgical History:  Past Surgical History  Procedure Laterality Date  . 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 He was recently admitted to [facility] senior  living secondary to ongoing pain from shingles over the last 3 months and  debilitation. While there he was transported in wheelchair and started to  have a slump to the left side. His wife noticed that he was unable to  communicate except with slurred words and Pio Eatherly phrases. He became  increasingly lethargic. On the date of admission, he was trying to drink  some water and coughed/choked and developed hypoxia so he was transported  to the emergency department for reevaluation.in the emergency department,  he was unable to provide any history and was lethargic. Chest x-ray was  concerning for pneumonia.  MBS ordered to determine if po diet may be  tolerated and palliative has seen pt.  Right perihilar opacity is noted  concerning for possible pneumonia, neoplasm can not be ruled out.   No Data Recorded  Assessment / Plan / Recommendation CHL IP CLINICAL IMPRESSIONS  11/12/2014  Therapy Diagnosis Mild oral phase dysphagia;Suspected primary esophageal  dysphagia  Clinical Impression Mild oral and suspected primary esophageal phase  dysphagia WITHOUT aspiration or penetration of any consistency tested.   Oral  transiting delays noted due to weakness apparent.  Lingual rocking  with pill while taking with pudding noted.  Trace oral residuals of thin  prematurely spilled into pharynx without pt sensation.  Pharyngeal swallow  was overall strong without severe residuals.  Barium tablet given with  pudding appeared to lodge at distal esophagus WITHOUT pt sensation, liquid  swallows of thin barium and water faciliated clearance, suspect component  of esophageal dysmotility- radiologist not present to confirm.    Educated pt and daughter Vaughan Basta to findings and reinforced effective  compensation strategies.  Recommend consider diet advancement to Dys3/thin  with general precautions assuring adequate liquid intake during meals to  faciliate esophageal clearance.      CHL IP TREATMENT RECOMMENDATION 11/12/2014  Treatment Recommendations No treatment recommended at this time     CHL IP DIET RECOMMENDATION 11/12/2014  SLP Diet Recommendations Thin;Dysphagia 3 (Mech soft)  Liquid Administration via thin  Medication Administration Whole meds with puree, follow with liquids  Compensations Slow rate;Small sips/bites;Follow solids with liquid  Postural Changes and/or Swallow Maneuvers stay upright after meals     CHL IP OTHER RECOMMENDATIONS 11/12/2014  Recommended Consults (None)  Oral Care Recommendations Oral care BID  Other Recommendations (None)        CHL IP FREQUENCY AND DURATION 11/10/2014  Speech Therapy Frequency (ACUTE ONLY) min 2x/week  Treatment Duration 1 week         CHL IP REASON FOR REFERRAL 11/12/2014  Reason for Referral Objectively evaluate swallowing function     CHL IP ORAL PHASE 11/12/2014  Oral Phase Impaired      CHL IP PHARYNGEAL PHASE 11/12/2014  Pharyngeal Phase WFL      CHL IP CERVICAL ESOPHAGEAL PHASE 11/12/2014  Cervical Esophageal Phase Impaired  Cervical Esophageal Comment barium tablet given with pudding appeared to  lodge at distal esophagus WITHOUT pt sensation, liquid swallows of thin  barium and water  faciliated clearance, suspect component of esophageal  dysmotility- radiologist not present to confirm    Luanna Salk, Liborio Negron Torres [facility] SLP [phone]             Microbiology: Recent Results (from the past 240 hour(s))  Culture, blood (routine x 2) Call MD if unable to obtain prior to antibiotics being given     Status: None (Preliminary result)   Collection Time: 11/08/14 11:55 PM  Result Value Ref Range Status   Specimen Description BLOOD LEFT ANTECUBITAL  Final   Special Requests BOTTLES DRAWN AEROBIC AND ANAEROBIC 10CC  Final   Culture   Final    NO GROWTH 3 DAYS Performed at [facility]    Report Status PENDING  Incomplete  Culture, blood (routine x 2) Call MD if unable to obtain prior to antibiotics being given     Status: None (Preliminary result)   Collection Time: 11/09/14 12:12 AM  Result Value Ref Range Status   Specimen Description BLOOD LEFT ARM  Final   Special Requests BOTTLES DRAWN AEROBIC AND ANAEROBIC 5ML  Final   Culture   Final    NO GROWTH 3 DAYS Performed at [facility]    Report Status PENDING  Incomplete  MRSA PCR Screening     Status: None   Collection Time: 11/09/14  4:09 AM  Result Value Ref Range  Status   MRSA by PCR NEGATIVE NEGATIVE Final    Comment:        The GeneXpert MRSA Assay (FDA approved for NASAL specimens only), is one component of a comprehensive MRSA colonization surveillance program. It is not intended to diagnose MRSA infection nor to guide or monitor treatment for MRSA infections.      Labs: Basic Metabolic Panel:  Recent Labs Lab 11/08/14 1815 11/09/14 0406 11/11/14 0411 11/12/14 0422 11/13/14 0405  NA 141 138 137 139 140  K 4.7 4.4 3.6 3.7 4.3  CL 104 103 105 106 106  CO2 29 27 25 27 25   GLUCOSE 134* 131* 118* 122* 81  BUN 74* 70* 51* 38* 30*  CREATININE 3.29* 3.06* 2.12* 1.82* 1.70*  CALCIUM 9.1 8.6* 8.2* 8.4* 8.5*   Liver Function Tests:  Recent Labs Lab 11/08/14 1815 11/09/14 0406  AST 21 15   ALT 16* 15*  ALKPHOS 73 65  BILITOT 0.6 0.6  PROT 6.7 6.2*  ALBUMIN 3.2* 2.9*   No results for input(s): LIPASE, AMYLASE in the last 168 hours. No results for input(s): AMMONIA in the last 168 hours. CBC:  Recent Labs Lab 11/08/14 1815 11/09/14 0406 11/11/14 0411 11/12/14 0422 11/13/14 0405  WBC 15.7* 15.2* 7.6 5.6 8.8  NEUTROABS 14.6*  --   --   --   --   HGB 13.0 11.7* 11.6* 11.7* 13.4  HCT 40.2 36.8* 36.3* 35.7* 41.5  MCV 98.3 97.1 98.6 97.8 97.6  PLT 234 221 252 259 270   Cardiac Enzymes: No results for input(s): CKTOTAL, CKMB, CKMBINDEX, TROPONINI in the last 168 hours. BNP: BNP (last 3 results) No results for input(s): BNP in the last 8760 hours.  ProBNP (last 3 results) No results for input(s): PROBNP in the last 8760 hours.  CBG: No results for input(s): GLUCAP in the last 168 hours.  Time coordinating discharge: 35 minutes  Signed:  Myrel Rappleye  [facility] 11/13/2014, 9:16 AM

## 2014-11-11 NOTE — Progress Notes (Signed)
Daily Progress Note   Patient Name: Blake Harrison       Date: 11/11/2014 DOB: 1925-05-07  Age: 81 y.o. MRN#: [id number] Attending Physician: Janece Canterbury, MD Primary Care Physician: Sandi Mariscal, MD Admit Date: 11/08/2014  Reason for Consultation/Follow-up: Establishing goals of care  Subjective:  awake alert, confused at times.   Interval Events:  Daughter at bedside, she is in town till Tuesday, after which she has to go back to [place]. Her number is [phone]  Hospice consult today, discussions still ongoing about appropriate disposition, if patient not an inpatient hospice candidate, will need SNF level of care.  Discussed risk of silent aspiration at all times, MBS study in am, discussed comfort feedings, discussed safe strategies and to allow sips of thickened liquids today as per patient request.  Daughter states patient's wife has caregiver stress and burnout, she probably also has mild cognitive difficulties. Daughter states she is co Psychiatrist and would like to be called first.  Patient with melena, will D/C EC ASA and also D/C Lovenox.   Length of Stay: 3 days  Current Medications: Scheduled Meds:  . carbamazepine  100 mg Oral BID  . citalopram  20 mg Oral Daily  . [START ON 11/12/2014] ketoconazole  1 application Topical Once per day on Mon Thu  . lidocaine  2 patch Transdermal Q breakfast  . neomycin-bacitracin-polymyxin  1 application Topical Daily  . piperacillin-tazobactam (ZOSYN)  IV  2.25 g Intravenous 4 times per day    Continuous Infusions: . dextrose 5 % and 0.45% NaCl 50 mL/hr at 11/10/14 1900    PRN Meds: acetaminophen, bisacodyl, food thickener, ondansetron **OR** ondansetron (ZOFRAN) IV  Palliative Performance Scale:  20%    Vital Signs: BP 90/54 mmHg  Pulse 61  Temp(Src) 97.8 F (36.6 C) (Oral)  Resp 18  Ht 5' 5.5" (1.664 m)  Wt 61.236 kg (135 lb)  BMI 22.12 kg/m2  SpO2 100% SpO2: SpO2: 100 % O2 Device: O2 Device: Nasal Cannula O2  Flow Rate: O2 Flow Rate (L/min): 2 L/min  Intake/output summary:   Intake/Output Summary (Last 24 hours) at 11/11/14 1052 Last data filed at 11/11/14 0900  Gross per 24 hour  Intake 1541.67 ml  Output    600 ml  Net 941.67 ml   LBM:   Baseline Weight: Weight: 61.236 kg (135 lb) Most recent weight: Weight: 61.236 kg (135 lb)  Physical Exam:           Patient with left facial droop. Awake confused Lungs with coarse rhonchi S1-S2 Abdomen soft Additional Data Reviewed: Recent Labs     11/09/14  0406  11/11/14  0411  WBC  15.2*  7.6  HGB  11.7*  11.6*  PLT  221  252  NA  138  137  BUN  70*  51*  CREATININE  3.06*  2.12*     Problem List:  Patient Active Problem List   Diagnosis Date Noted  . AKI (acute kidney injury) 11/09/2014  . HCAP (healthcare-associated pneumonia)   . Encounter for palliative care   . Aspiration pneumonia 11/08/2014  . HAP (hospital-acquired pneumonia) 11/08/2014  . Hypertension 11/08/2014  . Dementia 11/08/2014  . Probable acute CVA (cerebrovascular accident) 11/08/2014  . Post herpetic neuralgia 09/04/2014  . PROSTATE CANCER 05/26/2007  . Seasonal and perennial allergic rhinitis 05/26/2007  . TRACHEOBRONCHITIS 05/26/2007     Palliative Care Assessment & Plan  Possible cerebrovascular accident give and left-sided weakness facial droop lethargy and difficulty speaking  Metabolic encephalopathy secondary to underlying infection Aspiration pneumonia Recent history of shingles  Code Status:  DNR  Goals of Care: Continue current mode of care, do not escalate care. Patient is on IV antibiotics. Discussed goals of care with daughter.  Discussed monitoring patient's disease trajectory to help guide further decision making for disposition. Discussed would be appropriate to add hospice services at this time. Patient's PO intake may not be sufficient to sustain him for very long.   Desire for further Chaplaincy support: Patient's family is being  supported by their rabbi  Symptom Management: Tylenol PRN  has when necessary IV Ativan available. 4. Palliative Prophylaxis:  Stool Softener: Has Dulcolax rectal suppository available to be used on an as-needed basis.  5. Prognosis: < 2 weeks  5. Discharge Planning: hospice consult, watch trajectory.    Care plan was discussed with patient's daughter, RN  Thank you for allowing the Palliative Medicine Team to assist in the care of this patient.   Time In: 0830 Time Out: 0855 Total Time 25 Prolonged Time Billed  no     Greater than 50%  of this time was spent counseling and coordinating care related to the above assessment and plan.   Loistine Chance, MD  [phone] 11/11/2014, 10:52 AM  Please contact Palliative Medicine Team phone at [phone] for questions and concerns.

## 2014-11-11 NOTE — Progress Notes (Addendum)
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 He was recently admitted to [facility] senior living secondary to ongoing pain from shingles over the last 3 months and debilitation.  While there he was transported in wheelchair and started to have a slump to the left side. His wife noticed that he was unable to communicate except with slurred words and Blake Harrison phrases. He became increasingly lethargic. On the date of admission, he was trying to drink some water and coughed/choked and developed hypoxia so he was transported to the emergency department for reevaluation.in the emergency department, he was unable to provide any history and was lethargic.  Chest x-ray was concerning for pneumonia.  7/15:  Patient found almost unresponsive by PT/OT with left facial droop 7/15:  Palliative care consulted, limited interventions only at this time 7/16:  Awake and improved with more articulate speech, persistent left facial droop, diet advanced while awaiting speech therapy assessment  Assessment/Plan  Probable stroke given left hemiparesis including facial droop, lethargy, increased difficulty speaking. - continue palliative dysphagia 1 with nectar pending MBS -  Plan for MBS tomorrow -  Oral care -  Maintenance IV fluids -  Aspiration precautions -  IV Ativan as needed for seizures or agitation  Probable aspiration pneumonia -  Discontinued vancomycin since this medication will require vancomycin troughs and I suspect that he probably has aspiration pneumonia. He is only been at [facility] for a few days -  Continue Zosyn and will transition to augmentin at discharge -  Aspiration precautions  Postherpetic neuralgia -  Continue tegretol  -  Continue to hold  gabapentin -  D/c IV dilaudid and start prn tylenol -  Continue lidocaine patch  Leukocytosis, likely secondary to aspiration pneumonia -  Awaiting urinalysis:  Hematuria and a few WBC 11-20, but only a few bacteria -  Continue antibiotics  Acute kidney injury, baseline creatinine near 1 and currently 3, improving with IVF -  Continue IV fluids -  Monitor for signs of hypervolemia  Dementia without behavioral disturbance, likely vascular dementia -  Starting to focus on symptom management  History of stroke -  Hold asa 81mg  daily due to occult positive stool -  Hgb approximately stable  Occult stool positive with blood seen mixed in stool -  Recommended against further work up due to frailty and severity of dementia  Diet:  Dysphagia 1 with nectar Access:  PIV IVF:  yes Proph:  Lovenox  Code Status: DO NOT RESUSCITATE Family Communication: spoke with the patient's daughter in person Disposition Plan:  Awaiting MBS, anticipate discharge to SNF with palliative or with hospice care whichever can be arranged.  Consultants:  Palliative care  Procedures:  Chest x-ray  Antibiotics:  Vancomycin x1  Zosyn 7/14 >   HPI/Subjective:   Denies pain, SOB, cough, nausea.  Asking repeatedly for a glass of wine.    Objective: Filed Vitals:   11/10/14 1405 11/10/14 2002 11/11/14 0457 11/11/14 1405  BP: 116/61 141/52 90/54 127/74  Pulse: 63 55 61 100  Temp: 98.3 F (36.8 C) 97.5 F (36.4 C) 97.8 F (36.6 C) 98 F (36.7 C)  TempSrc: Oral Oral Oral Oral  Resp: 18 18 18 20   Height:      Weight:      SpO2: 97% 100% 100% 100%  Intake/Output Summary (Last 24 hours) at 11/11/14 1509 Last data filed at 11/11/14 1230  Gross per 24 hour  Intake 1601.67 ml  Output    500 ml  Net 1101.67 ml   Filed Weights   11/09/14 1200  Weight: 61.236 kg (135 lb)   Body mass index is 22.12 kg/(m^2).  Exam:   General:   Adult male, awake, alert, making eye contact and able to  communicate today. His words are better understood and articulated although he is clearly confused. He does not know where he is.   HEENT:  NCAT, MMM  Cardiovascular:  RRR, nl S1, S2 no mrg, 2+ pulses, warm extremities  Respiratory:  rhonchorous breath sounds, no wheezes, no rales heard anteriorly, no increased WOB  Abdomen:   NABS, soft, NT/ND  MSK:   increased tone and decreased bulk, no LEE. Developing contractures of hands  Neuro:  Grossly moves arms.  3/5 strength legs.  Curled in fetal position on left side  Data Reviewed: Basic Metabolic Panel:  Recent Labs Lab 11/08/14 1815 11/09/14 0406 11/11/14 0411  NA 141 138 137  K 4.7 4.4 3.6  CL 104 103 105  CO2 29 27 25   GLUCOSE 134* 131* 118*  BUN 74* 70* 51*  CREATININE 3.29* 3.06* 2.12*  CALCIUM 9.1 8.6* 8.2*   Liver Function Tests:  Recent Labs Lab 11/08/14 1815 11/09/14 0406  AST 21 15  ALT 16* 15*  ALKPHOS 73 65  BILITOT 0.6 0.6  PROT 6.7 6.2*  ALBUMIN 3.2* 2.9*   No results for input(s): LIPASE, AMYLASE in the last 168 hours. No results for input(s): AMMONIA in the last 168 hours. CBC:  Recent Labs Lab 11/08/14 1815 11/09/14 0406 11/11/14 0411  WBC 15.7* 15.2* 7.6  NEUTROABS 14.6*  --   --   HGB 13.0 11.7* 11.6*  HCT 40.2 36.8* 36.3*  MCV 98.3 97.1 98.6  PLT 234 221 252    Recent Results (from the past 240 hour(s))  Culture, blood (routine x 2) Call MD if unable to obtain prior to antibiotics being given     Status: None (Preliminary result)   Collection Time: 11/08/14 11:55 PM  Result Value Ref Range Status   Specimen Description BLOOD LEFT ANTECUBITAL  Final   Special Requests BOTTLES DRAWN AEROBIC AND ANAEROBIC 10CC  Final   Culture   Final    NO GROWTH 2 DAYS Performed at [facility]    Report Status PENDING  Incomplete  Culture, blood (routine x 2) Call MD if unable to obtain prior to antibiotics being given     Status: None (Preliminary result)   Collection Time: 11/09/14  12:12 AM  Result Value Ref Range Status   Specimen Description BLOOD LEFT ARM  Final   Special Requests BOTTLES DRAWN AEROBIC AND ANAEROBIC 5ML  Final   Culture   Final    NO GROWTH 2 DAYS Performed at [facility]    Report Status PENDING  Incomplete  MRSA PCR Screening     Status: None   Collection Time: 11/09/14  4:09 AM  Result Value Ref Range Status   MRSA by PCR NEGATIVE NEGATIVE Final    Comment:        The GeneXpert MRSA Assay (FDA approved for NASAL specimens only), is one component of a comprehensive MRSA colonization surveillance program. It is not intended to diagnose MRSA infection nor to guide or monitor treatment for MRSA infections.      Studies: No results found.  Scheduled Meds: . carbamazepine  100 mg Oral BID  . citalopram  20 mg Oral Daily  . [START ON 11/12/2014] ketoconazole  1 application Topical Once per day on Mon Thu  . lidocaine  2 patch Transdermal Q breakfast  . neomycin-bacitracin-polymyxin  1 application Topical Daily  . piperacillin-tazobactam (ZOSYN)  IV  2.25 g Intravenous 4 times per day   Continuous Infusions: . dextrose 5 % and 0.45% NaCl 50 mL/hr at 11/10/14 1900    Principal Problem:   Aspiration pneumonia Active Problems:   PROSTATE CANCER   Post herpetic neuralgia   Hypertension   Dementia   Probable acute CVA (cerebrovascular accident)   AKI (acute kidney injury)   HCAP (healthcare-associated pneumonia)   Encounter for palliative care   Melena    Time spent: 30 min    Caitlinn Klinker, [facility] Pager [phone]. If 7PM-7AM, please contact night-coverage at [email or web address], password [email or web address] 11/11/2014, 3:09 PM  LOS: 3 days

## 2014-11-12 ENCOUNTER — Inpatient Hospital Stay (HOSPITAL_COMMUNITY): Payer: Medicare PPO

## 2014-11-12 DIAGNOSIS — F0391 Unspecified dementia with behavioral disturbance: Secondary | ICD-10-CM

## 2014-11-12 LAB — CBC
HCT: 35.7 % — ABNORMAL LOW (ref 39.0–52.0)
HEMOGLOBIN: 11.7 g/dL — AB (ref 13.0–17.0)
MCH: 32.1 pg (ref 26.0–34.0)
MCHC: 32.8 g/dL (ref 30.0–36.0)
MCV: 97.8 fL (ref 78.0–100.0)
Platelets: 259 10*3/uL (ref 150–400)
RBC: 3.65 MIL/uL — AB (ref 4.22–5.81)
RDW: 13.2 % (ref 11.5–15.5)
WBC: 5.6 10*3/uL (ref 4.0–10.5)

## 2014-11-12 LAB — BASIC METABOLIC PANEL
Anion gap: 6 (ref 5–15)
BUN: 38 mg/dL — ABNORMAL HIGH (ref 6–20)
CHLORIDE: 106 mmol/L (ref 101–111)
CO2: 27 mmol/L (ref 22–32)
CREATININE: 1.82 mg/dL — AB (ref 0.61–1.24)
Calcium: 8.4 mg/dL — ABNORMAL LOW (ref 8.9–10.3)
GFR, EST AFRICAN AMERICAN: 37 mL/min — AB (ref >60)
GFR, EST NON AFRICAN AMERICAN: 32 mL/min — AB (ref >60)
Glucose, Bld: 122 mg/dL — ABNORMAL HIGH (ref 65–99)
POTASSIUM: 3.7 mmol/L (ref 3.5–5.1)
Sodium: 139 mmol/L (ref 135–145)

## 2014-11-12 LAB — LEGIONELLA ANTIGEN, URINE

## 2014-11-12 MED ORDER — GABAPENTIN 300 MG PO CAPS
900.0000 mg | ORAL_CAPSULE | Freq: Three times a day (TID) | ORAL | Status: DC
  Filled 2014-11-12 (×3): qty 3

## 2014-11-12 MED ORDER — GABAPENTIN 300 MG PO CAPS
300.0000 mg | ORAL_CAPSULE | Freq: Three times a day (TID) | ORAL | Status: DC
  Administered 2014-11-12: 300 mg via ORAL
  Filled 2014-11-12 (×3): qty 1

## 2014-11-12 MED ORDER — LORAZEPAM 0.5 MG PO TABS
0.5000 mg | ORAL_TABLET | ORAL | Status: DC | PRN
  Administered 2014-11-12: 0.5 mg via ORAL
  Filled 2014-11-12: qty 1

## 2014-11-12 MED ORDER — GABAPENTIN 300 MG PO CAPS: 600.0000 mg | ORAL_CAPSULE | Freq: Three times a day (TID) | ORAL | Status: DC

## 2014-11-12 MED ORDER — PIPERACILLIN-TAZOBACTAM 3.375 G IVPB
3.3750 g | Freq: Three times a day (TID) | INTRAVENOUS | Status: DC
  Administered 2014-11-12 – 2014-11-13 (×3): 3.375 g via INTRAVENOUS
  Filled 2014-11-12 (×4): qty 50

## 2014-11-12 MED ORDER — MORPHINE SULFATE (CONCENTRATE) 10 MG/0.5ML PO SOLN
5.0000 mg | ORAL | Status: DC | PRN
  Administered 2014-11-12 – 2014-11-13 (×3): 5 mg via ORAL
  Filled 2014-11-12 (×3): qty 0.5

## 2014-11-12 MED ORDER — GABAPENTIN 300 MG PO CAPS
300.0000 mg | ORAL_CAPSULE | Freq: Two times a day (BID) | ORAL | Status: DC
  Administered 2014-11-12 – 2014-11-13 (×2): 300 mg via ORAL
  Filled 2014-11-12 (×3): qty 1

## 2014-11-12 MED ORDER — HALOPERIDOL 2 MG PO TABS
2.0000 mg | ORAL_TABLET | Freq: Four times a day (QID) | ORAL | Status: DC | PRN
  Filled 2014-11-12: qty 1

## 2014-11-12 NOTE — Progress Notes (Signed)
Daily Progress Note   Patient Name: Blake Harrison       Date: 11/12/2014 DOB: [birth year]  Age: 81 y.o. MRN#: [id number] Attending Physician: Janece Canterbury, MD Primary Care Physician: Sandi Mariscal, MD Admit Date: 11/08/2014  Reason for Consultation/Follow-up: Establishing goals of care Life limiting illness: CVA, aspiration PNA, GIB, dementia, recent shingles.  Subjective:  awake alert, in distress, complains of back pain.  Wants to go home.  Interval Events:  Daughter Vaughan Basta and wife [name] are at bedside,   Hospice consult today, discussions still ongoing about appropriate disposition, if patient not an inpatient hospice candidate, will need SNF level of care.    MBS study results noted in am, continue safe strategies for dysphagia 3/ thin liquids as recommended by speech therapy  Patient with ongoing melena, now D/C ASA and D/C Lovenox.  Consider low dose haldol for agitation, anxiety. Watch for paradoxical reaction with Ativan.    Length of Stay: 4 days  Current Medications: Scheduled Meds:  . carbamazepine  100 mg Oral BID  . citalopram  20 mg Oral Daily  . gabapentin  300 mg Oral TID  . ketoconazole  1 application Topical Once per day on Mon Thu  . lidocaine  2 patch Transdermal Q breakfast  . neomycin-bacitracin-polymyxin  1 application Topical Daily  . piperacillin-tazobactam (ZOSYN)  IV  3.375 g Intravenous Q8H    Continuous Infusions: . dextrose 5 % and 0.45% NaCl 50 mL/hr at 11/11/14 1738    PRN Meds: acetaminophen, bisacodyl, food thickener, LORazepam, morphine CONCENTRATE, ondansetron **OR** ondansetron (ZOFRAN) IV  Palliative Performance Scale:  20%    Vital Signs: BP 143/69 mmHg  Pulse 64  Temp(Src) 98.2 F (36.8 C) (Oral)  Resp 20  Ht 5' 5.5" (1.664 m)  Wt 61.236 kg (135 lb)  BMI 22.12 kg/m2  SpO2 96% SpO2: SpO2: 96 % O2 Device: O2 Device: Nasal Cannula O2 Flow Rate: O2 Flow Rate (L/min): 2 L/min  Intake/output summary:   Intake/Output Summary  (Last 24 hours) at 11/12/14 1041 Last data filed at 11/12/14 0620  Gross per 24 hour  Intake 1656.67 ml  Output      0 ml  Net 1656.67 ml   LBM:   Baseline Weight: Weight: 61.236 kg (135 lb) Most recent weight: Weight: 61.236 kg (135 lb)  Physical Exam:           Patient confused, agitated, wants to go home, complains of back hurting. Lungs with coarse rhonchi S1-S2 Abdomen soft Additional Data Reviewed: Recent Labs     11/11/14  0411  11/12/14  0422  WBC  7.6  5.6  HGB  11.6*  11.7*  PLT  252  259  NA  137  139  BUN  51*  38*  CREATININE  2.12*  1.82*     Problem List:  Patient Active Problem List   Diagnosis Date Noted  . Melena   . AKI (acute kidney injury) 11/09/2014  . HCAP (healthcare-associated pneumonia)   . Encounter for palliative care   . Aspiration pneumonia 11/08/2014  . HAP (hospital-acquired pneumonia) 11/08/2014  . Hypertension 11/08/2014  . Dementia 11/08/2014  . Probable acute CVA (cerebrovascular accident) 11/08/2014  . Post herpetic neuralgia 09/04/2014  . PROSTATE CANCER 05/26/2007  . Seasonal and perennial allergic rhinitis 05/26/2007  . TRACHEOBRONCHITIS 05/26/2007     Palliative Care Assessment & Plan  Possible cerebrovascular accident give and left-sided weakness facial droop lethargy and difficulty speaking Metabolic encephalopathy secondary to underlying infection Aspiration pneumonia Recent  history of shingles GIB  Code Status:  DNR  Goals of Care: Shifting towards comfort as primary goal. Patient now with GIB, agitation. Overall decline for past few weeks. Consider residential hospice.     Desire for further Chaplaincy support: Patient's family is being supported by their rabbi  Symptom Management: Morphine solution Consider Haldol low dose 4. Palliative Prophylaxis:  Stool Softener: Has Dulcolax rectal suppository available to be used on an as-needed basis.  5. Prognosis: < 2 weeks  5. Discharge Planning: hospice  consult, watch trajectory.    Care plan was discussed with patient's daughter, wife, Dr Sheran Fava, case management and RN  Thank you for allowing the Palliative Medicine Team to assist in the care of this patient.   Time In: 1030 Time Out: 1035 Total Time 35 Prolonged Time Billed  no     Greater than 50%  of this time was spent counseling and coordinating care related to the above assessment and plan.   Loistine Chance, MD  [phone] 11/12/2014, 10:41 AM  Please contact Palliative Medicine Team phone at [phone] for questions and concerns.

## 2014-11-12 NOTE — Evaluation (Signed)
Occupational Therapy Evaluation Patient Details Name: Blake Harrison MRN: [id number] DOB: 1925-06-29 Today's Date: 11/12/2014    History of Present Illness Pt is an 81 year old male admitted for probable stroke given left hemiparesis including facial droop, lethargy, increased difficulty speaking as well as probable aspiration pneumonia with hx of CVA, dementia, peripheral neuropathy and post herpetic neuralgia of left upper thoracic region   Clinical Impression   Pt admitted with probable CVA. Pt currently with functional limitations due to the deficits listed below (see OT Problem List).  Pt will benefit from skilled OT to increase their safety and independence with ADL and functional mobility for ADL to facilitate discharge to venue listed below.      Follow Up Recommendations  SNF    Equipment Recommendations  None recommended by OT       Precautions / Restrictions Precautions Precautions: Fall Precaution Comments: previous CVA and probable recent CVA with L sided hemiparesis      Mobility Bed Mobility Overal bed mobility: Needs Assistance Bed Mobility: Rolling Rolling: Max assist         General bed mobility comments: rolled L and R- no complaints of pain  Transfers          did not perform                 ADL Overall ADL's : Needs assistance/impaired                                       General ADL Comments: Pts famiily present. Pt max- total A with ADL activity at this time . Daughter not sure of pts PLOF (daughter from out of town)               Pertinent Vitals/Pain Pain Assessment: No/denies pain Pain Location: did not complain Pain Intervention(s): Monitored during session;Repositioned        Extremity/Trunk Assessment Upper Extremity Assessment Upper Extremity Assessment: LUE deficits/detail LUE Deficits / Details: LUE decreased AROM from CVA   Lower Extremity Assessment LLE Deficits / Details: increased guarding  of L side making testing and mobility difficult (also possible recent CVA effecting L side) LLE: Unable to fully assess due to pain          Cognition Arousal/Alertness: Awake/alert Behavior During Therapy: WFL for tasks assessed/performed Overall Cognitive Status: History of cognitive impairments - at baseline (family reports cognition and speech much improved today, hx dementia)                     General Comments   one daughter states pt is going to [facility] and one seems to think is he able to do rehab?            Home Living Family/patient expects to be discharged to:: Skilled nursing facility (or hospice?)                                        Prior Functioning/Environment               OT Diagnosis: Generalized weakness   OT Problem List: Decreased strength;Decreased range of motion;Decreased activity tolerance;Impaired balance (sitting and/or standing)   OT Treatment/Interventions: Self-care/ADL training;Therapeutic exercise;Patient/family education    OT Goals(Current goals can be found in the care plan section) Acute Rehab  OT Goals Patient Stated Goal: did not state Time For Goal Achievement: 11/26/14  OT Frequency: Min 2X/week   Barriers to D/C:               End of Session Nurse Communication: Mobility status  Activity Tolerance: Patient tolerated treatment well Patient left: in bed;with call bell/phone within reach;with family/visitor present   Time: 1410-1420 OT Time Calculation (min): 10 min Charges:  OT General Charges $OT Visit: 1 Procedure OT Evaluation $Initial OT Evaluation Tier I: 1 Procedure G-Codes:    Betsy Pries 2014-12-01, 2:35 PM

## 2014-11-12 NOTE — Progress Notes (Signed)
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 He was recently admitted to [facility] senior living secondary to ongoing pain from shingles over the last 3 months and debilitation.  While there he was transported in wheelchair and started to have a slump to the left side. His wife noticed that he was unable to communicate except with slurred words and [place] phrases. He became increasingly lethargic. On the date of admission, he was trying to drink some water and coughed/choked and developed hypoxia so he was transported to the emergency department for reevaluation.in the emergency department, he was unable to provide any history and was lethargic.  Chest x-ray was concerning for pneumonia.  7/15:  Patient found almost unresponsive by PT/OT with left facial droop 7/15:  Palliative care consulted, limited interventions only at this time 7/16:  Awake and improved with more articulate speech, persistent left facial droop, diet advanced while awaiting speech therapy assessment  Assessment/Plan  Probable stroke given left hemiparesis including facial droop, lethargy, increased difficulty speaking.  Improving somewhat.   -  MBS:  Dysphagia 3 with thin liquids -  D/c IV fluids -  Aspiration precautions -  IV Ativan as needed for seizures or agitation  Probable aspiration pneumonia -  Continue Zosyn and will transition to augmentin at discharge -  Aspiration precautions  Postherpetic neuralgia, severe pain today -  Continue tegretol  -  Resume gabapentin -  Start morphine prn -  Continue lidocaine patch  Leukocytosis, likely secondary to aspiration pneumonia -  Awaiting urinalysis:  Hematuria and a few WBC 11-20, but only a few bacteria -  Continue  antibiotics  Acute kidney injury, baseline creatinine near 1 and currently 3, improving with IVF -  Continue IV fluids -  Monitor for signs of hypervolemia  Dementia without behavioral disturbance, likely vascular dementia -  Start prn ativan  History of stroke -  Hold asa 81mg  daily due to occult positive stool -  Hgb approximately stable  Melena with blood seen mixed in stool -  Holding aspirin and lovenox -  Not planning any further work up and had 12 stools in last 24 hours  Diet:  Dysphagia 1 with nectar Access:  PIV IVF:  off Proph:  SCDs  Code Status: DO NOT RESUSCITATE Family Communication: spoke with the patient's daughter in person Disposition Plan:  Still having melena and not planning further work up.  Awaiting hospice evaluation but probable discharge to inpatient hospice when bed available.  PT/OT to attempt to work with patient today.  Consultants:  Palliative care  Procedures:  Chest x-ray  Antibiotics:  Vancomycin x1  Zosyn 7/14 >   HPI/Subjective:   Having severe pain of the left flank and buttocks.  Denies nausea, vomiting, SOB.  Had a nosebleed last night attributed to his oxygen by nasal canula.    Objective: Filed Vitals:   11/11/14 1405 11/11/14 2027 11/12/14 0236 11/12/14 0617  BP: 127/74 105/47  143/69  Pulse: 100 61  64  Temp: 98 F (36.7 C) 98 F (36.7 C)  98.2 F (36.8 C)  TempSrc: Oral Oral  Oral  Resp: 20 20  20   Height:      Weight:      SpO2: 100% 100% 93% 96%    Intake/Output Summary (Last 24 hours) at 11/12/14 1226  Last data filed at 11/12/14 0620  Gross per 24 hour  Intake 1656.67 ml  Output      0 ml  Net 1656.67 ml   Filed Weights   11/09/14 1200  Weight: 61.236 kg (135 lb)   Body mass index is 22.12 kg/(m^2).  Exam:   General:   Adult male, awake, alert, crying out in pain.  Room smells like melena.  HEENT:  NCAT, MMM  Cardiovascular:  RRR, nl S1, S2 no mrg, 2+ pulses, warm  extremities  Respiratory:  CTAB, no increased WOB  Abdomen:   NABS, soft, NT/ND  MSK:   Increased tone and decreased bulk, no LEE. Developing contractures of hands  Neuro:  Grossly moves arms.  3/5 strength legs.  Curled in fetal position on left side  Data Reviewed: Basic Metabolic Panel:  Recent Labs Lab 11/08/14 1815 11/09/14 0406 11/11/14 0411 11/12/14 0422  NA 141 138 137 139  K 4.7 4.4 3.6 3.7  CL 104 103 105 106  CO2 29 27 25 27   GLUCOSE 134* 131* 118* 122*  BUN 74* 70* 51* 38*  CREATININE 3.29* 3.06* 2.12* 1.82*  CALCIUM 9.1 8.6* 8.2* 8.4*   Liver Function Tests:  Recent Labs Lab 11/08/14 1815 11/09/14 0406  AST 21 15  ALT 16* 15*  ALKPHOS 73 65  BILITOT 0.6 0.6  PROT 6.7 6.2*  ALBUMIN 3.2* 2.9*   No results for input(s): LIPASE, AMYLASE in the last 168 hours. No results for input(s): AMMONIA in the last 168 hours. CBC:  Recent Labs Lab 11/08/14 1815 11/09/14 0406 11/11/14 0411 11/12/14 0422  WBC 15.7* 15.2* 7.6 5.6  NEUTROABS 14.6*  --   --   --   HGB 13.0 11.7* 11.6* 11.7*  HCT 40.2 36.8* 36.3* 35.7*  MCV 98.3 97.1 98.6 97.8  PLT 234 221 252 259    Recent Results (from the past 240 hour(s))  Culture, blood (routine x 2) Call MD if unable to obtain prior to antibiotics being given     Status: None (Preliminary result)   Collection Time: 11/08/14 11:55 PM  Result Value Ref Range Status   Specimen Description BLOOD LEFT ANTECUBITAL  Final   Special Requests BOTTLES DRAWN AEROBIC AND ANAEROBIC 10CC  Final   Culture   Final    NO GROWTH 2 DAYS Performed at [facility]    Report Status PENDING  Incomplete  Culture, blood (routine x 2) Call MD if unable to obtain prior to antibiotics being given     Status: None (Preliminary result)   Collection Time: 11/09/14 12:12 AM  Result Value Ref Range Status   Specimen Description BLOOD LEFT ARM  Final   Special Requests BOTTLES DRAWN AEROBIC AND ANAEROBIC 5ML  Final   Culture   Final     NO GROWTH 2 DAYS Performed at [facility]    Report Status PENDING  Incomplete  MRSA PCR Screening     Status: None   Collection Time: 11/09/14  4:09 AM  Result Value Ref Range Status   MRSA by PCR NEGATIVE NEGATIVE Final    Comment:        The GeneXpert MRSA Assay (FDA approved for NASAL specimens only), is one component of a comprehensive MRSA colonization surveillance program. It is not intended to diagnose MRSA infection nor to guide or monitor treatment for MRSA infections.      Studies: Dg Swallowing Func-speech Pathology  11/12/2014    Objective Swallowing Evaluation:    Patient  Details  Name: Blake Harrison MRN: [id number] Date of Birth: 1925/11/30  Today's Date: 11/12/2014 Time: SLP Start Time (ACUTE ONLY): 0915-SLP Stop Time (ACUTE ONLY): 0930 SLP Time Calculation (min) (ACUTE ONLY): 15 min  Past Medical History:  Past Medical History  Diagnosis Date  . ALLERGIC RHINITIS   . Arthritis     knee  . Prostate cancer     seeds  . CVA (cerebral infarction) 2011  . Dementia   . Peripheral neuropathy   . Post herpetic neuralgia 07/2014    left upper thoracic region   Past Surgical History:  Past Surgical History  Procedure Laterality Date  . 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 He was recently admitted to [facility] senior  living secondary to ongoing pain from shingles over the last 3 months and  debilitation. While there he was transported in wheelchair and started to  have a slump to the left side. His wife noticed that he was unable to  communicate except with slurred words and Blake Harrison phrases. He became  increasingly lethargic. On the date of admission, he was trying to drink  some water and coughed/choked and developed hypoxia so he was transported  to the emergency department for reevaluation.in the emergency  department,  he was unable to provide any history and was lethargic. Chest x-ray was  concerning for pneumonia.  MBS ordered to determine if po diet may be  tolerated and palliative has seen pt.  Right perihilar opacity is noted  concerning for possible pneumonia, neoplasm can not be ruled out.   No Data Recorded  Assessment / Plan / Recommendation CHL IP CLINICAL IMPRESSIONS 11/12/2014  Therapy Diagnosis Mild oral phase dysphagia;Suspected primary esophageal  dysphagia  Clinical Impression Mild oral and suspected primary esophageal phase  dysphagia WITHOUT aspiration or penetration of any consistency tested.   Oral transiting delays noted due to weakness apparent.  Lingual rocking  with pill while taking with pudding noted.  Trace oral residuals of thin  prematurely spilled into pharynx without pt sensation.  Pharyngeal swallow  was overall strong without severe residuals.  Barium tablet given with  pudding appeared to lodge at distal esophagus WITHOUT pt sensation, liquid  swallows of thin barium and water faciliated clearance, suspect component  of esophageal dysmotility- radiologist not present to confirm.    Educated pt and daughter Blake Harrison to findings and reinforced effective  compensation strategies.  Recommend consider diet advancement to Dys3/thin  with general precautions assuring adequate liquid intake during meals to  faciliate esophageal clearance.      CHL IP TREATMENT RECOMMENDATION 11/12/2014  Treatment Recommendations No treatment recommended at this time     CHL IP DIET RECOMMENDATION 11/12/2014  SLP Diet Recommendations Thin;Dysphagia 3 (Mech soft)  Liquid Administration via thin  Medication Administration Whole meds with puree, follow with liquids  Compensations Slow rate;Small sips/bites;Follow solids with liquid  Postural Changes and/or Swallow Maneuvers stay upright after meals     CHL IP OTHER RECOMMENDATIONS 11/12/2014  Recommended Consults (None)  Oral Care Recommendations Oral care BID  Other  Recommendations (None)        CHL IP FREQUENCY AND DURATION 11/10/2014  Speech Therapy Frequency (ACUTE ONLY) min 2x/week  Treatment Duration 1 week         CHL IP REASON FOR REFERRAL 11/12/2014  Reason for Referral  Objectively evaluate swallowing function     CHL IP ORAL PHASE 11/12/2014  Oral Phase Impaired      CHL IP PHARYNGEAL PHASE 11/12/2014  Pharyngeal Phase WFL      CHL IP CERVICAL ESOPHAGEAL PHASE 11/12/2014  Cervical Esophageal Phase Impaired  Cervical Esophageal Comment barium tablet given with pudding appeared to  lodge at distal esophagus WITHOUT pt sensation, liquid swallows of thin  barium and water faciliated clearance, suspect component of esophageal  dysmotility- radiologist not present to confirm    Luanna Salk, [facility] SLP [phone]             Scheduled Meds: . carbamazepine  100 mg Oral BID  . citalopram  20 mg Oral Daily  . gabapentin  300 mg Oral TID  . ketoconazole  1 application Topical Once per day on Mon Thu  . lidocaine  2 patch Transdermal Q breakfast  . neomycin-bacitracin-polymyxin  1 application Topical Daily  . piperacillin-tazobactam (ZOSYN)  IV  3.375 g Intravenous Q8H   Continuous Infusions: . dextrose 5 % and 0.45% NaCl 50 mL/hr at 11/11/14 1738    Principal Problem:   Aspiration pneumonia Active Problems:   PROSTATE CANCER   Post herpetic neuralgia   Hypertension   Dementia   Probable acute CVA (cerebrovascular accident)   AKI (acute kidney injury)   HCAP (healthcare-associated pneumonia)   Encounter for palliative care   Melena    Time spent: 30 min    Adriona Kaney, [facility] Pager [phone]. If 7PM-7AM, please contact night-coverage at [email or web address], password [facility] 11/12/2014, 12:26 PM  LOS: 4 days

## 2014-11-12 NOTE — Progress Notes (Signed)
Assessment / Plan / Recommendation CHL IP CLINICAL IMPRESSIONS 11/12/2014  Therapy Diagnosis Mild oral phase dysphagia;Suspected primary esophageal dysphagia  Clinical Impression Mild oral and suspected primary esophageal phase dysphagia WITHOUT aspiration or penetration of any consistency tested. Oral transiting delays noted due to weakness apparent. Lingual rocking with pill while taking with pudding noted. Trace oral residuals of thin prematurely spilled into pharynx without pt sensation. Pharyngeal swallow was overall strong without severe residuals. Barium tablet given with pudding appeared to lodge at distal esophagus WITHOUT pt sensation, liquid swallows of thin barium and water faciliated clearance, suspect component of esophageal dysmotility- radiologist not present to confirm.   Educated pt and daughter Vaughan Basta to findings and reinforced effective compensation strategies. Recommend consider diet advancement to Dys3/thin with general precautions assuring adequate liquid intake during meals to faciliate esophageal clearance.      CHL IP TREATMENT RECOMMENDATION 11/12/2014  Treatment Recommendations No treatment recommended at this time     CHL IP DIET RECOMMENDATION 11/12/2014  SLP Diet Recommendations Thin;Dysphagia 3 (Mech soft)  Liquid Administration via thin  Medication Administration Whole meds with puree, follow with liquids  Compensations Slow rate;Small sips/bites;Follow solids with liquid  Postural Changes and/or Swallow Maneuvers stay upright after meals     CHL IP OTHER RECOMMENDATIONS 11/12/2014  Recommended Consults (None)  Oral Care Recommendations Oral care BID  Other Recommendations (None)        CHL IP FREQUENCY AND DURATION 11/10/2014  Speech Therapy Frequency (ACUTE ONLY) min 2x/week  Treatment Duration 1 week         CHL IP REASON FOR REFERRAL 11/12/2014  Reason for Referral Objectively evaluate  swallowing function     CHL IP ORAL PHASE 11/12/2014  Oral Phase Impaired      CHL IP PHARYNGEAL PHASE 11/12/2014  Pharyngeal Phase WFL     CHL IP CERVICAL ESOPHAGEAL PHASE 11/12/2014  Cervical Esophageal Phase Impaired  Cervical Esophageal Comment barium tablet given with pudding appeared to lodge at distal esophagus WITHOUT pt sensation, liquid swallows of thin barium and water faciliated clearance, suspect component of esophageal dysmotility- radiologist not present to confirm    Luanna Salk, [place] [facility] [place] [phone]

## 2014-11-12 NOTE — Progress Notes (Signed)
CSW continuing to follow.   CSW received update from MD and palliative MD today. MD's are now recommending residential hospice referral. Per MD's, if pt does not qualify for residential hospice then SNF will need to be explored.  CSW met with pt wife and pt daughter in hallway to discuss disposition planning. CSW discussed with pt wife and pt daughter recommendation for residential hospice vs SNF with palliative care depending on hospice evaluation. CSW provided list of residential hospice facilities. Pt wife and pt daughter choose [facility] for residential hospice. Pt wife and pt daughter are hopeful that [facility] will be able to accept pt as they do not feel that pt would be able to tolerate SNF for rehab. CSW provided support and clarified pt wife and pt daughters questions.   CSW made referral to [facility], Erling Conte.   Pt was faxed out to SNF over the weekend and CSW to provide bed offers if necessary.   CSW to continue to follow to provide support and assist with pt disposition needs.   Addendum 1:00 pm:   CSW continuing to follow.  CSW received notification from [facility], Erling Conte that pt is appropriate for [facility] and bed available tomorrow 11/13/2014.   CSW notified MD.   CSW to facilitate pt discharge needs to [facility] tomorrow.  Alison Murray, MSW, [place] [facility] [phone]

## 2014-11-12 NOTE — ED Provider Notes (Signed)
CSN: [id number]     Arrival date & time 11/08/14  1609 History   First MD Initiated Contact with Patient 11/08/14 1632     Chief Complaint  Patient presents with  . Weakness     (Consider location/radiation/quality/duration/timing/severity/associated sxs/prior Treatment) HPI Patient presents to the emergency department with weakness and hematuria.  The patient is a resident at [facility] and he may have aspirated tonight trying to drink water.  The patient has had some coughing since the event and his pulse oximetry was low at the facility.  She is unable to give me any history due to the fact that he has dementia and is not communicating with me during my interview.  I am getting the history from a caregiver.  Patient is not had any nausea, vomiting, or fevers Past Medical History  Diagnosis Date  . ALLERGIC RHINITIS   . Arthritis     knee  . Prostate cancer     seeds  . CVA (cerebral infarction) 2011  . Dementia   . Peripheral neuropathy   . Post herpetic neuralgia 07/2014    left upper thoracic region   Past Surgical History  Procedure Laterality Date  . Total knee arthroplasty      left   Family History  Problem Relation Age of Onset  . Heart attack Father    History  Substance Use Topics  . Smoking status: Never Smoker   . Smokeless tobacco: Not on file  . Alcohol Use: 0.0 oz/week    0 Standard drinks or equivalent per week     Comment: wine once in a while     Review of Systems Level 5 caveat applies due to dementia   Allergies  Shellfish allergy  Home Medications   Prior to Admission medications   Medication Sig Start Date End Date Taking? Authorizing Provider  aspirin EC 81 MG tablet Take 81 mg by mouth daily.   Yes Historical Provider, MD  carbamazepine (TEGRETOL) 100 MG chewable tablet Chew 100 mg by mouth 2 (two) times daily.   Yes Historical Provider, MD  carbamazepine (TEGRETOL) 200 MG tablet Take 200 mg by mouth at bedtime.  10/17/14  Yes  Historical Provider, MD  citalopram (CELEXA) 20 MG tablet Take 20 mg by mouth daily.  06/16/12  Yes Historical Provider, MD  cyanocobalamin (,VITAMIN B-12,) 1000 MCG/ML injection Inject 1 mL (1,000 mcg total) into the skin every 30 (thirty) days. Instructions: to be given subq once a week for 4 weeks and then once a month for 6 months. 10/10/14  Yes Penni Bombard, MD  gabapentin (NEURONTIN) 300 MG capsule Take 3 capsules (900 mg total) by mouth 3 (three) times daily. Patient taking differently: Take 600 mg by mouth at bedtime.  10/02/14  Yes Penni Bombard, MD  hydrocortisone 2.5 % cream Apply 1 application topically daily as needed (skin irritation).  09/15/14  Yes Historical Provider, MD  ketoconazole (NIZORAL) 2 % shampoo Apply 1 application topically 2 (two) times a week. 10/10/14  Yes Historical Provider, MD  lidocaine (LIDODERM) 5 % Place 2 patches onto the skin at bedtime. Apply 2 patches to left side of back. On 12 hrs, off 12 hrs. 11/06/14  Yes Historical Provider, MD  lisinopril-hydrochlorothiazide (PRINZIDE,ZESTORETIC) 20-12.5 MG per tablet Take 1 tablet by mouth daily.    Yes Historical Provider, MD  Multiple Vitamin (MULTIVITAMIN) tablet Take 1 tablet by mouth daily.     Yes Historical Provider, MD  neomycin-bacitracin-polymyxin (NEOSPORIN) [phone] ointment Apply  1 application topically daily. Apply to abrasion until healed   Yes Historical Provider, MD  NUTRITIONAL SUPPLEMENTS PO Take 237 mLs by mouth 4 (four) times daily.   Yes Historical Provider, MD  promethazine (PHENERGAN) 25 MG tablet Take 25 mg by mouth every 8 (eight) hours as needed for nausea or vomiting. Do not exceed 3 tabs /24 hrs   Yes Historical Provider, MD   BP 105/47 mmHg  Pulse 61  Temp(Src) 98 F (36.7 C) (Oral)  Resp 20  Ht 5' 5.5" (1.664 m)  Wt 135 lb (61.236 kg)  BMI 22.12 kg/m2  SpO2 100% Physical Exam  Constitutional: He appears well-developed and well-nourished. No distress.  HENT:  Head:  Normocephalic and atraumatic.  Mouth/Throat: Oropharynx is clear and moist.  Eyes: Pupils are equal, round, and reactive to light.  Neck: Normal range of motion. Neck supple.  Cardiovascular: Normal rate, regular rhythm and normal heart sounds.  Exam reveals no gallop and no friction rub.   No murmur heard. Pulmonary/Chest: Effort normal. No respiratory distress. He has rhonchi.  Neurological: He is alert. He exhibits normal muscle tone. Coordination normal.  Skin: Skin is warm and dry. No rash noted. No erythema.  Psychiatric: He has a normal mood and affect. His behavior is normal.  Nursing note and vitals reviewed.   ED Course  Procedures (including critical care time) Labs Review Labs Reviewed  COMPREHENSIVE METABOLIC PANEL - Abnormal; Notable for the following:    Glucose, Bld 134 (*)    BUN 74 (*)    Creatinine, Ser 3.29 (*)    Albumin 3.2 (*)    ALT 16 (*)    GFR calc non Af Amer 16 (*)    GFR calc Af Amer 18 (*)    All other components within normal limits  CBC WITH DIFFERENTIAL/PLATELET - Abnormal; Notable for the following:    WBC 15.7 (*)    RBC 4.09 (*)    Neutrophils Relative % 93 (*)    Neutro Abs 14.6 (*)    Lymphocytes Relative 2 (*)    Lymphs Abs 0.3 (*)    All other components within normal limits  URINALYSIS, ROUTINE W REFLEX MICROSCOPIC (NOT AT [facility]) - Abnormal; Notable for the following:    Color, Urine AMBER (*)    APPearance CLOUDY (*)    Hgb urine dipstick LARGE (*)    Bilirubin Urine SMALL (*)    Protein, ur 30 (*)    Leukocytes, UA SMALL (*)    All other components within normal limits  CBC - Abnormal; Notable for the following:    WBC 15.2 (*)    RBC 3.79 (*)    Hemoglobin 11.7 (*)    HCT 36.8 (*)    All other components within normal limits  COMPREHENSIVE METABOLIC PANEL - Abnormal; Notable for the following:    Glucose, Bld 131 (*)    BUN 70 (*)    Creatinine, Ser 3.06 (*)    Calcium 8.6 (*)    Total Protein 6.2 (*)    Albumin 2.9 (*)     ALT 15 (*)    GFR calc non Af Amer 17 (*)    GFR calc Af Amer 20 (*)    All other components within normal limits  URINE MICROSCOPIC-ADD ON - Abnormal; Notable for the following:    Bacteria, UA FEW (*)    All other components within normal limits  BASIC METABOLIC PANEL - Abnormal; Notable for the following:    Glucose,  Bld 118 (*)    BUN 51 (*)    Creatinine, Ser 2.12 (*)    Calcium 8.2 (*)    GFR calc non Af Amer 26 (*)    GFR calc Af Amer 31 (*)    All other components within normal limits  CBC - Abnormal; Notable for the following:    RBC 3.68 (*)    Hemoglobin 11.6 (*)    HCT 36.3 (*)    All other components within normal limits  CULTURE, BLOOD (ROUTINE X 2)  CULTURE, BLOOD (ROUTINE X 2)  MRSA PCR SCREENING  CULTURE, EXPECTORATED SPUTUM-ASSESSMENT  GRAM STAIN  HIV ANTIBODY (ROUTINE TESTING)  STREP PNEUMONIAE URINARY ANTIGEN  LEGIONELLA ANTIGEN, URINE  BASIC METABOLIC PANEL  CBC  I-STAT CG4 LACTIC ACID, ED    Imaging Review No results found.   EKG Interpretation   Date/Time:  Thursday November 08 2014 16:46:26 EDT Ventricular Rate:  80 PR Interval:  225 QRS Duration: 101 QT Interval:  361 QTC Calculation: 416 R Axis:   -39 Text Interpretation:  Sinus rhythm Prolonged PR interval Left axis  deviation Nonspecific T abnormalities, lateral leads since last tracing no  significant change Confirmed by MILLER  MD, BRIAN ([id number]) on 11/08/2014  5:31:01 PM      MDM   Final diagnoses:  HCAP (healthcare-associated pneumonia)    Patient be admitted for pneumonia.  Spoke with the [facility] who will be down to evaluate the patient   Dalia Heading, PA-C 11/12/14 0202  Noemi Chapel, MD 11/13/14 [phone]

## 2014-11-12 NOTE — Progress Notes (Signed)
ANTIBIOTIC CONSULT NOTE  Pharmacy Consult for zosyn Indication: pneumonia  Allergies  Allergen Reactions  . Shellfish Allergy     Unknown reaction    Patient Measurements: weight 61 kg, height 65 inches Height: 5' 5.5" (166.4 cm) Weight: 135 lb (61.236 kg) IBW/kg (Calculated) : 62.65  Vital Signs: Temp: 98.2 F (36.8 C) (07/18 0617) Temp Source: Oral (07/18 0617) BP: 143/69 mmHg (07/18 0617) Pulse Rate: 64 (07/18 0617) Intake/Output from previous day: 07/17 0701 - 07/18 0700 In: 1656.7 [P.O.:240; I.V.:1216.7; IV Piggyback:200] Out: -  Intake/Output from this shift:    Labs:  Recent Labs  11/11/14 0411 11/12/14 0422  WBC 7.6 5.6  HGB 11.6* 11.7*  PLT 252 259  CREATININE 2.12* 1.82*   Estimated Creatinine Clearance: 24.8 mL/min (by C-G formula based on Cr of 1.82). No results for input(s): VANCOTROUGH, VANCOPEAK, VANCORANDOM, GENTTROUGH, GENTPEAK, GENTRANDOM, TOBRATROUGH, TOBRAPEAK, TOBRARND, AMIKACINPEAK, AMIKACINTROU, AMIKACIN in the last 72 hours.   Microbiology: Recent Results (from the past 720 hour(s))  Urine culture     Status: None   Collection Time: 11/01/14  1:49 PM  Result Value Ref Range Status   Specimen Description URINE, RANDOM  Final   Special Requests NONE  Final   Culture   Final    NO GROWTH 2 DAYS Performed at [facility]    Report Status 11/03/2014 FINAL  Final  Culture, blood (routine x 2) Call MD if unable to obtain prior to antibiotics being given     Status: None (Preliminary result)   Collection Time: 11/08/14 11:55 PM  Result Value Ref Range Status   Specimen Description BLOOD LEFT ANTECUBITAL  Final   Special Requests BOTTLES DRAWN AEROBIC AND ANAEROBIC 10CC  Final   Culture   Final    NO GROWTH 2 DAYS Performed at [facility]    Report Status PENDING  Incomplete  Culture, blood (routine x 2) Call MD if unable to obtain prior to antibiotics being given     Status: None (Preliminary result)   Collection Time:  11/09/14 12:12 AM  Result Value Ref Range Status   Specimen Description BLOOD LEFT ARM  Final   Special Requests BOTTLES DRAWN AEROBIC AND ANAEROBIC 5ML  Final   Culture   Final    NO GROWTH 2 DAYS Performed at [facility]    Report Status PENDING  Incomplete  MRSA PCR Screening     Status: None   Collection Time: 11/09/14  4:09 AM  Result Value Ref Range Status   MRSA by PCR NEGATIVE NEGATIVE Final    Comment:        The GeneXpert MRSA Assay (FDA approved for NASAL specimens only), is one component of a comprehensive MRSA colonization surveillance program. It is not intended to diagnose MRSA infection nor to guide or monitor treatment for MRSA infections.     Medical History: Past Medical History  Diagnosis Date  . ALLERGIC RHINITIS   . Arthritis     knee  . Prostate cancer     seeds  . CVA (cerebral infarction) 2011  . Dementia   . Peripheral neuropathy   . Post herpetic neuralgia 07/2014    left upper thoracic region    Medications:  See med rec   Assessment: Patient's an 81 y.o M who presents to the ED from [facility] senior living with c/o hematuria and lethargy. CXR showed R perihilar opacity.  Pharmacy dosing zosyn for likely aspiration pneumonia.  7/14 >> Vanc x1 @ 2200 >>  7/15 7/14 >> Zosyn >>  7/15 blood: NGTD Strep Ag: neg Legionella Ag: pending  WBC to WNL Renal - SCr improving, CrCl~24 ml/min Afebrile  Goal of Therapy:  Dose per patient specific parameters  Plan:  Day #5 zosyn -  Change Zosyn to 3.375gm IV q8h over 4h infusion for CrCl>20 ml/min.  Hershal Coria, PharmD, BCPS Pager: [phone] 11/12/2014 9:41 AM

## 2014-11-12 NOTE — Care Management Important Message (Signed)
Important Message  Patient Details  Name: Deryck Hippler MRN: [id number] Date of Birth: 1926/04/21   Medicare Important Message Given:  Yes-second notification given    Camillo Flaming 11/12/2014, 11:46 AMImportant Message  Patient Details  Name: Sanjiv Castorena MRN: [id number] Date of Birth: [birth year]   Medicare Important Message Given:  Yes-second notification given    Camillo Flaming 11/12/2014, 11:46 AM

## 2014-11-13 LAB — CBC
HEMATOCRIT: 41.5 % (ref 39.0–52.0)
HEMOGLOBIN: 13.4 g/dL (ref 13.0–17.0)
MCH: 31.5 pg (ref 26.0–34.0)
MCHC: 32.3 g/dL (ref 30.0–36.0)
MCV: 97.6 fL (ref 78.0–100.0)
Platelets: 270 10*3/uL (ref 150–400)
RBC: 4.25 MIL/uL (ref 4.22–5.81)
RDW: 13.3 % (ref 11.5–15.5)
WBC: 8.8 10*3/uL (ref 4.0–10.5)

## 2014-11-13 LAB — BASIC METABOLIC PANEL
Anion gap: 9 (ref 5–15)
BUN: 30 mg/dL — ABNORMAL HIGH (ref 6–20)
CHLORIDE: 106 mmol/L (ref 101–111)
CO2: 25 mmol/L (ref 22–32)
Calcium: 8.5 mg/dL — ABNORMAL LOW (ref 8.9–10.3)
Creatinine, Ser: 1.7 mg/dL — ABNORMAL HIGH (ref 0.61–1.24)
GFR, EST AFRICAN AMERICAN: 40 mL/min — AB (ref >60)
GFR, EST NON AFRICAN AMERICAN: 34 mL/min — AB (ref >60)
Glucose, Bld: 81 mg/dL (ref 65–99)
Potassium: 4.3 mmol/L (ref 3.5–5.1)
SODIUM: 140 mmol/L (ref 135–145)

## 2014-11-13 MED ORDER — GABAPENTIN 300 MG PO CAPS: 300.0000 mg | ORAL_CAPSULE | Freq: Two times a day (BID) | ORAL | Status: AC

## 2014-11-13 MED ORDER — LORAZEPAM 0.5 MG PO TABS: 0.5000 mg | ORAL_TABLET | ORAL | Status: AC | PRN

## 2014-11-13 MED ORDER — ACETAMINOPHEN 325 MG PO TABS: 650.0000 mg | ORAL_TABLET | ORAL | Status: AC | PRN

## 2014-11-13 MED ORDER — MORPHINE SULFATE (CONCENTRATE) 10 MG/0.5ML PO SOLN: 5.0000 mg | ORAL | Status: AC | PRN

## 2014-11-13 NOTE — Progress Notes (Signed)
Patient transferred to [facility], report given.

## 2014-11-13 NOTE — Progress Notes (Signed)
Pt for discharge to [facility].   CSW facilitated pt discharge needs including contacting facility, confirming [facility] liaison, Erling Conte faxed pt discharge summary to [facility], discussing with pt family at bedside, providing RN phone number to call report, and arranging ambulance transport for pt to [facility].  Pt wife and pt daughters eager about transition to [facility] and relieve that [facility] was able to accept pt.   No further social work needs identified at this time.  CSW signing off.   Alison Murray, MSW, [facility] [phone]

## 2014-11-14 LAB — CULTURE, BLOOD (ROUTINE X 2)
CULTURE: NO GROWTH
Culture: NO GROWTH

## 2014-12-18 ENCOUNTER — Encounter: Payer: Self-pay | Admitting: Internal Medicine

## 2014-12-20 ENCOUNTER — Telehealth: Payer: Self-pay | Admitting: Internal Medicine

## 2014-12-20 NOTE — Telephone Encounter (Signed)
Pt.'s last visit: All. Inj. Once a month,cont. To help. But I explained they could be stopped at any time.(06/30/12) Consider him d/c'd?

## 2014-12-21 NOTE — Telephone Encounter (Signed)
Sorry to here that,I didn't know. Nothing further needed.

## 2014-12-21 NOTE — Telephone Encounter (Signed)
I believe he expired

## 2014-12-27 DEATH — deceased

## 2015-01-08 ENCOUNTER — Ambulatory Visit: Payer: Medicare PPO | Admitting: Diagnostic Neuroimaging

## 2015-03-04 ENCOUNTER — Encounter: Payer: Self-pay | Admitting: Internal Medicine

## 2015-12-30 IMAGING — CR DG HAND COMPLETE 3+V*L*
4 series · 4 of 4 positions shown · non-contrast
Comparison: None.

CLINICAL DATA: Fall.

EXAM:
LEFT HAND - COMPLETE 3+ VIEW

[x hand pa left]
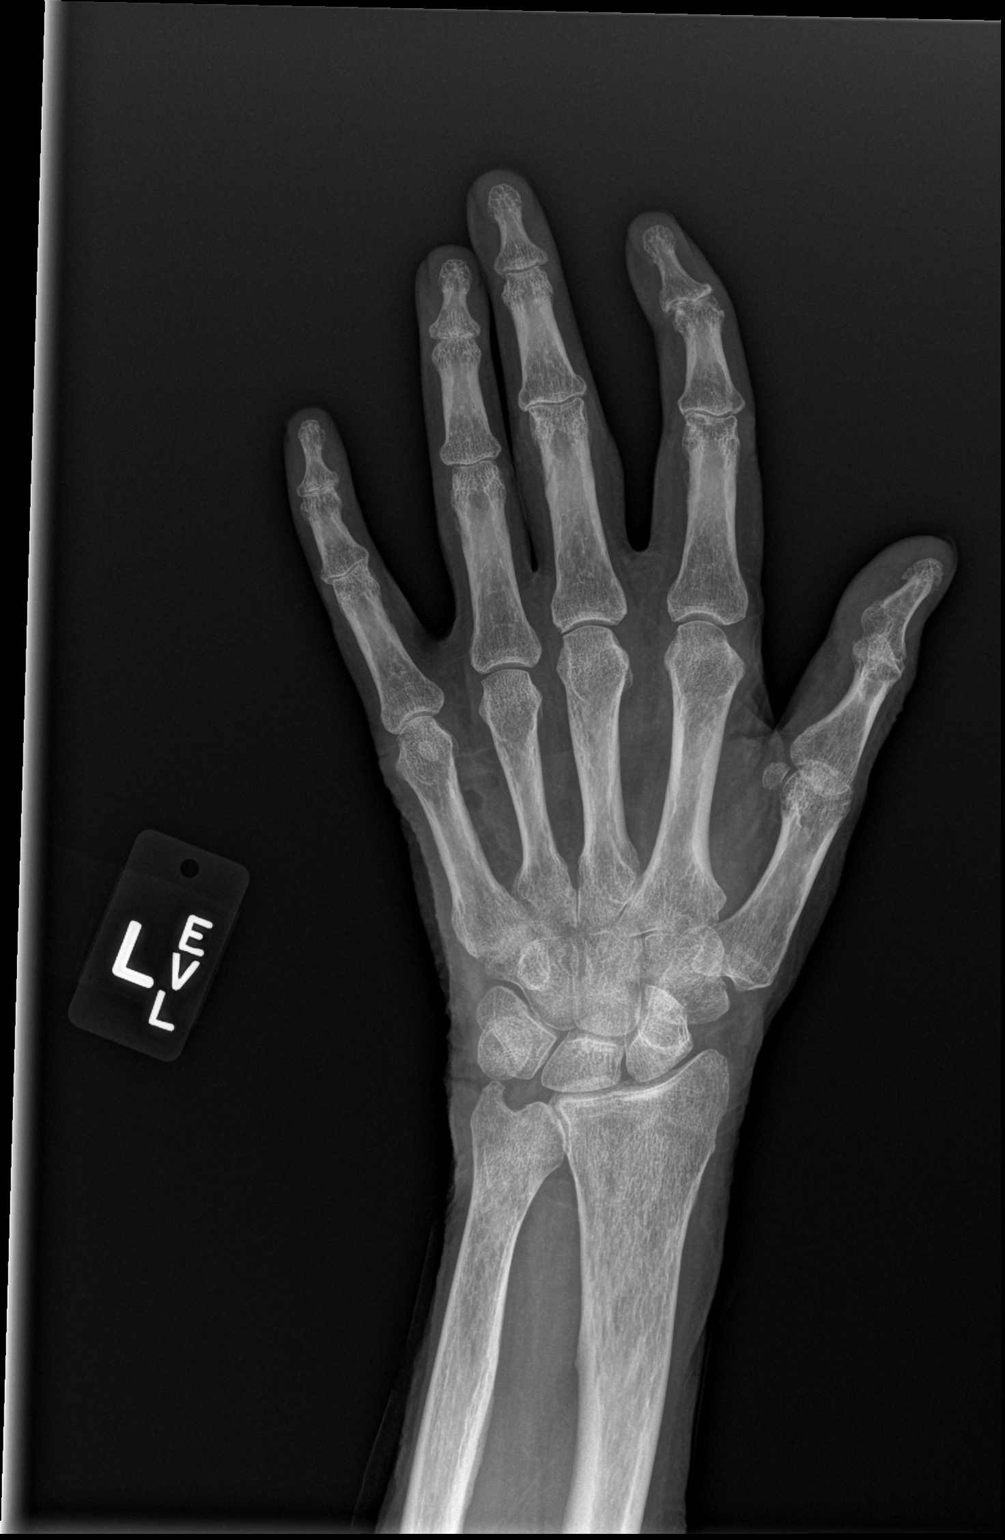

[x hand obl left]
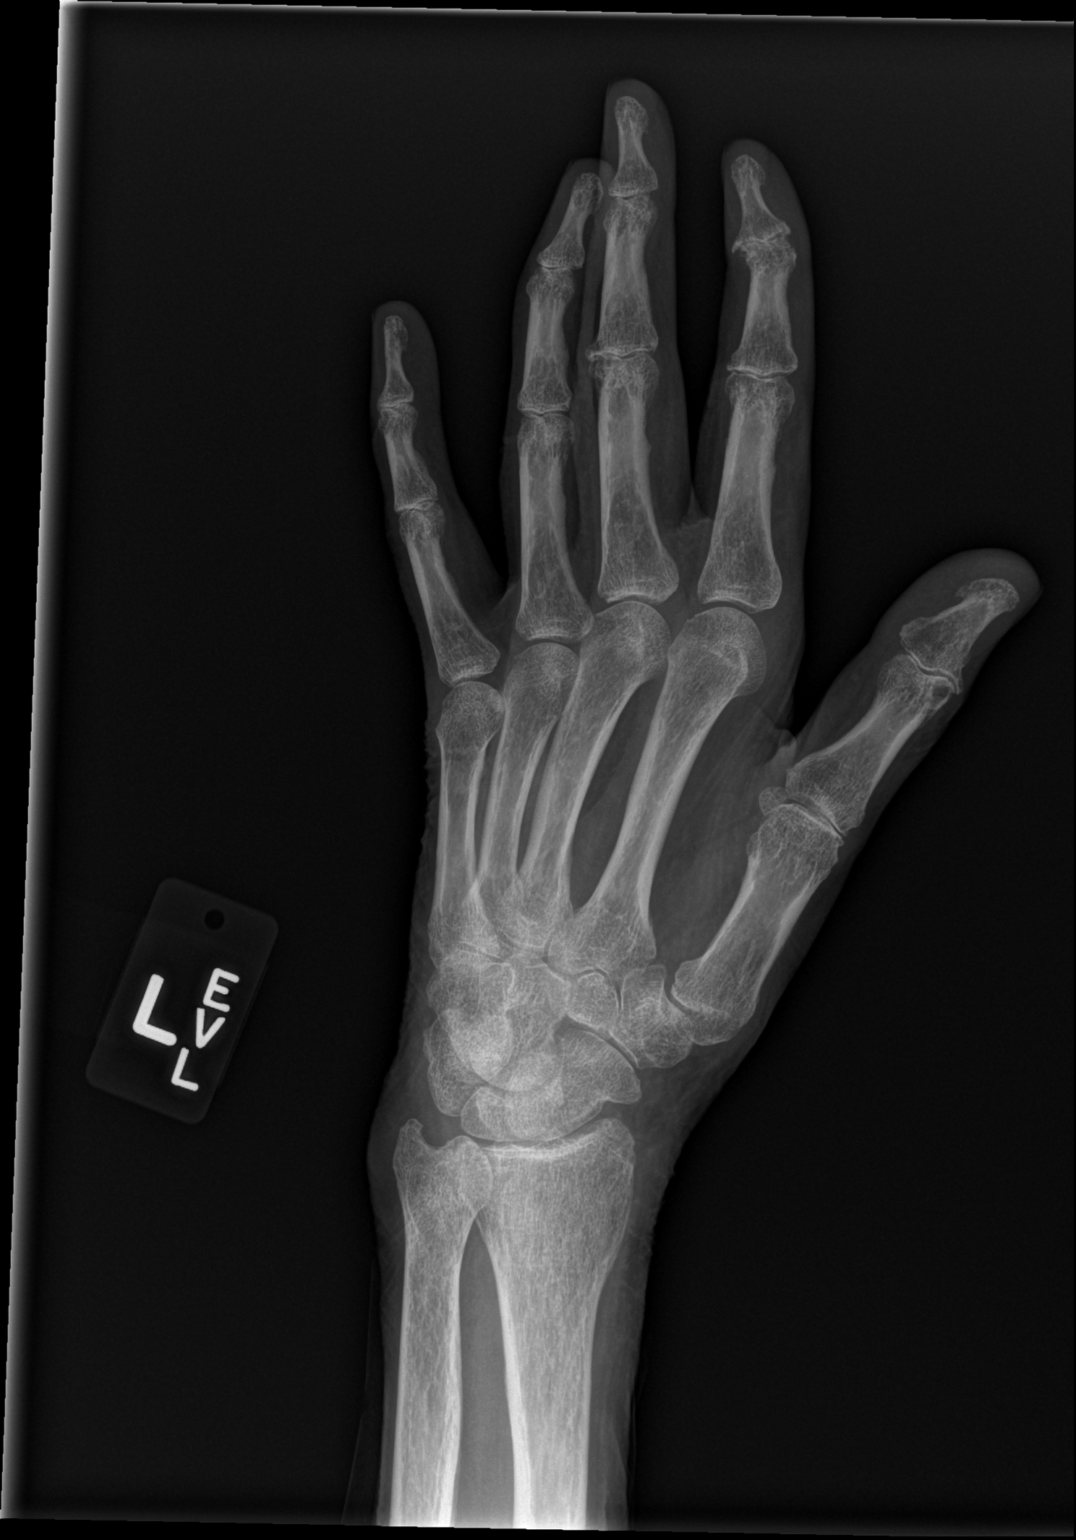

[x hand lat left (1 of 2)]
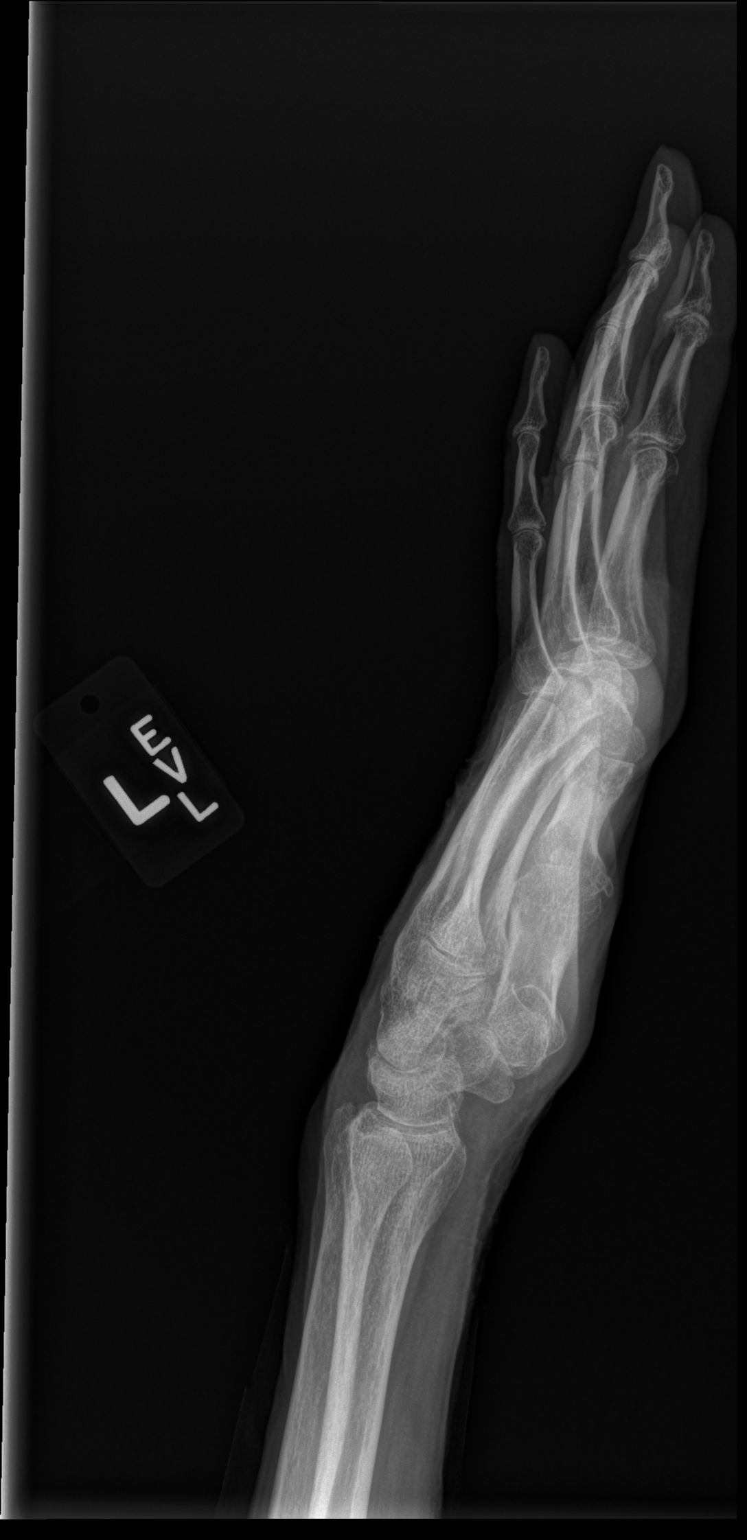

[x hand lat left (2 of 2)]
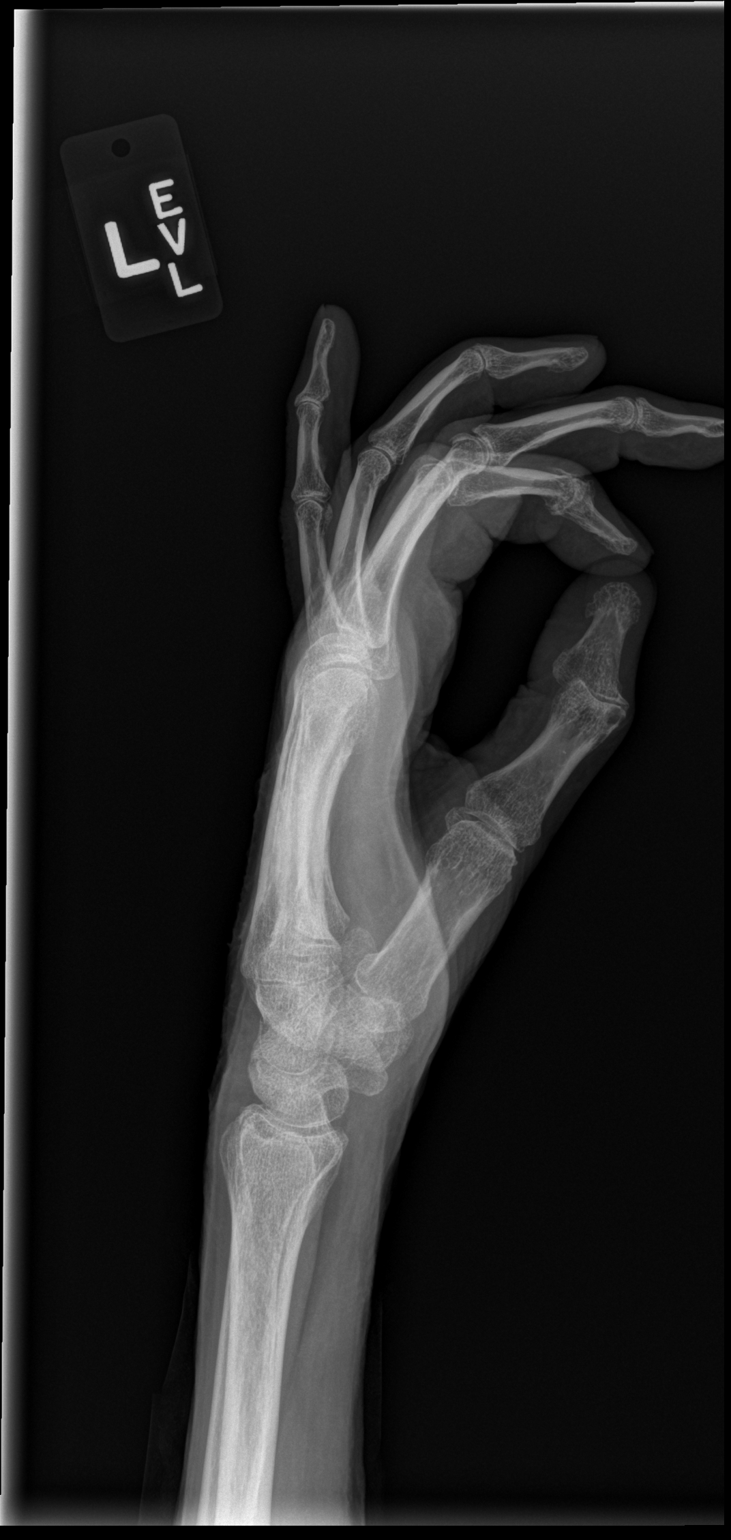

[4 of 4 positions shown; findings below may reference images not displayed]

FINDINGS: There is diffuse decreased bone mineralization. There are
degenerative changes over the radiocarpal joint and distal
radioulnar joint. Mild degenerate changes over the interphalangeal
joints and MCP joints. No acute fracture or dislocation.
IMPRESSION: No acute findings.

Mild degenerative changes over the wrist and hand.

## 2015-12-30 IMAGING — CT CT HEAD W/O CM
2 series · 17 of 30 positions shown, 20 images · non-contrast
Comparison: 01/28/2010

CLINICAL DATA: Shingles on left side and back. Complains of fall.
Weakness.

EXAM:
CT HEAD WITHOUT CONTRAST
TECHNIQUE: Contiguous axial images were obtained from the base of the skull
through the vertex without intravenous contrast.

[Series 2: head w/o · axial · non-contrast · 0.41mm/px · z∈[+659,+779]mm · 9 of 32 slices shown, 12 images]
[im 4/32  brain]
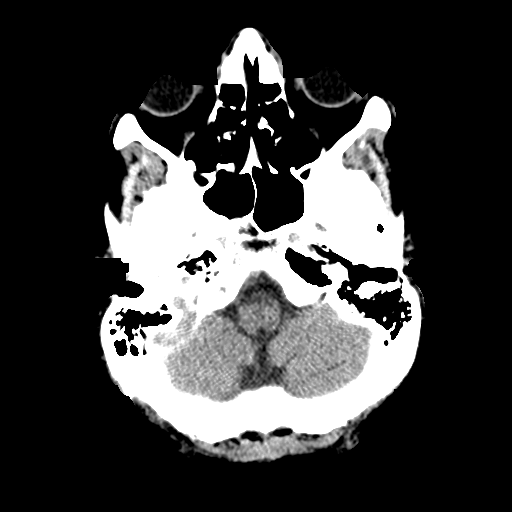
[im 4/32  bone]
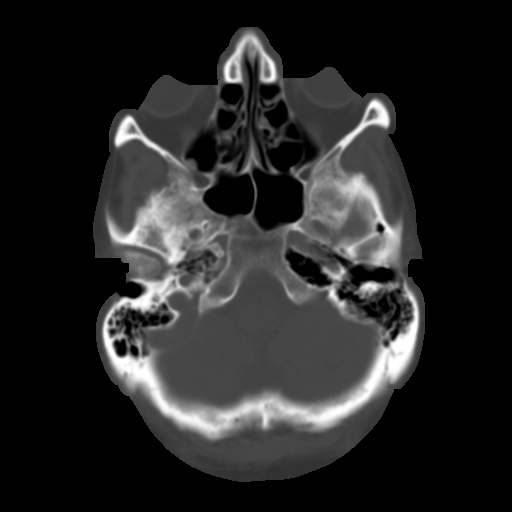
[im 7/32  brain]
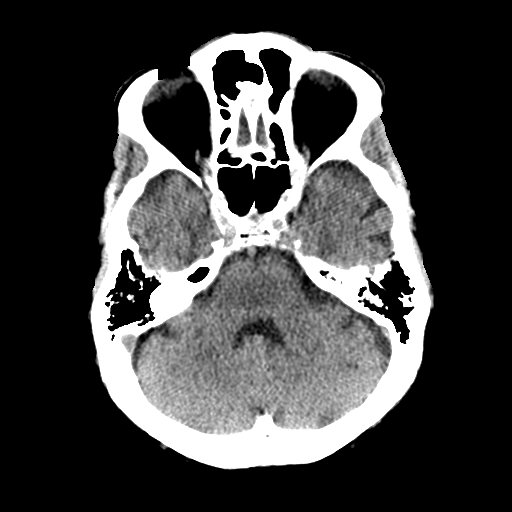
[im 10/32  brain]
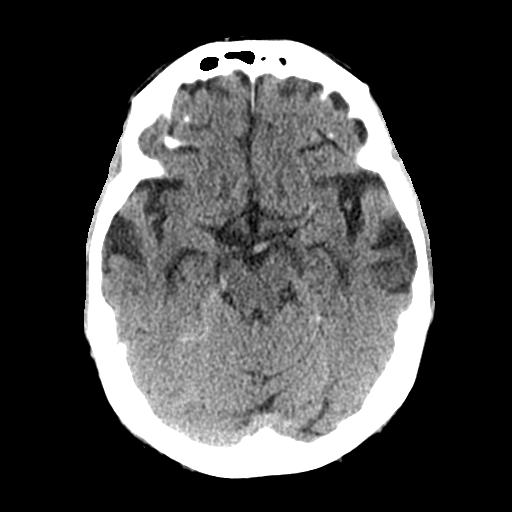
[im 13/32  brain]
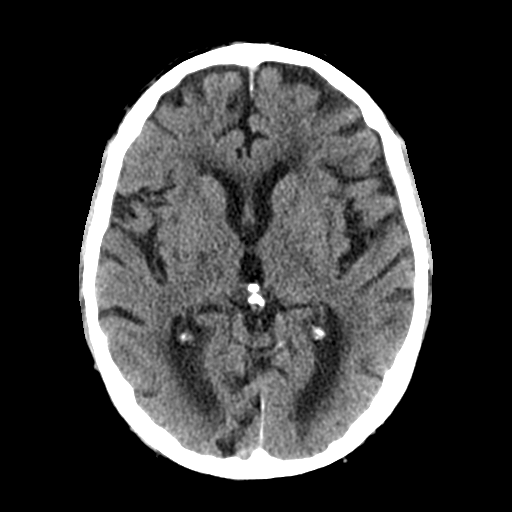
[im 16/32  brain]
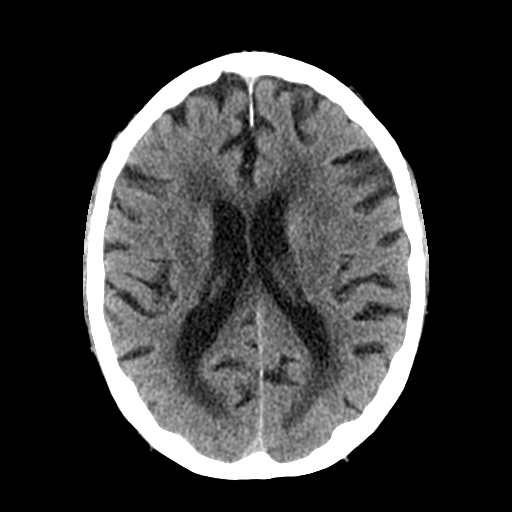
[im 16/32  bone]
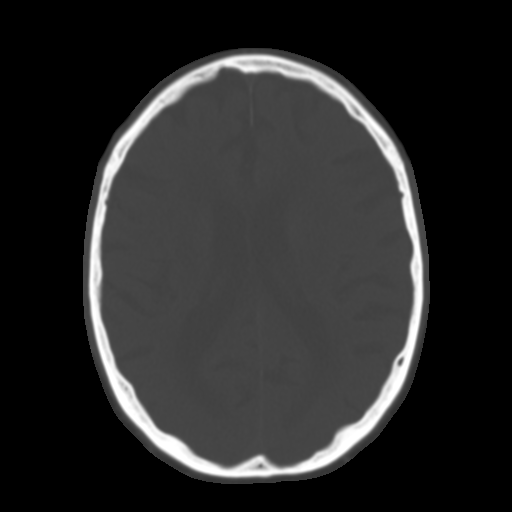
[im 19/32  brain]
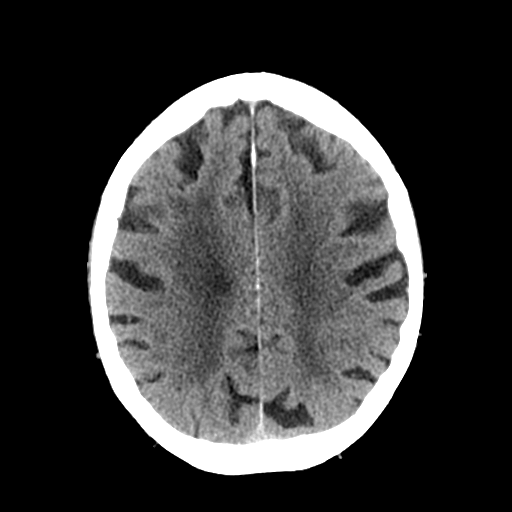
[im 22/32  brain]
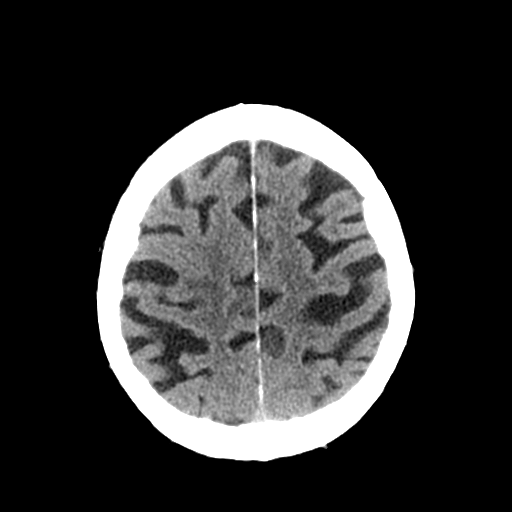
[im 25/32  brain]
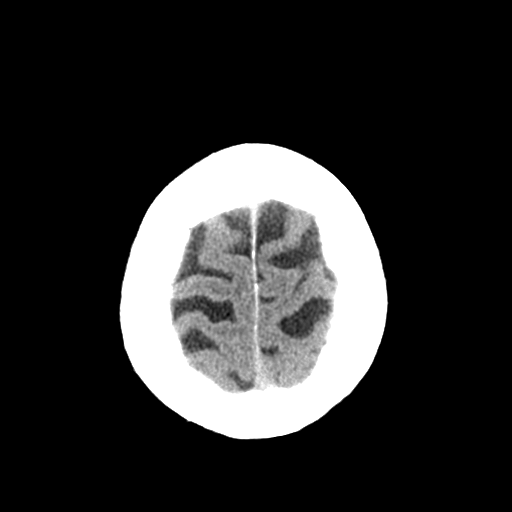
[im 28/32  brain]
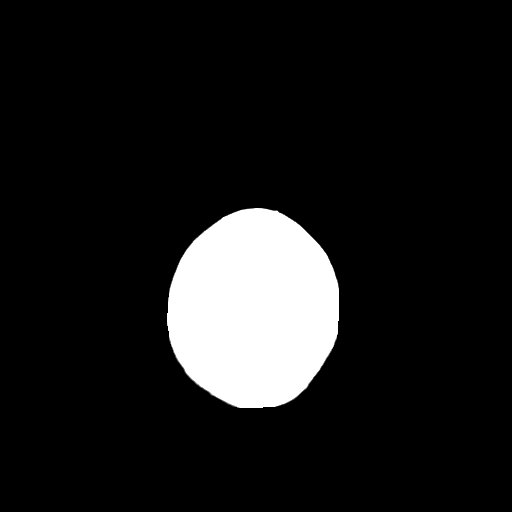
[im 28/32  bone]
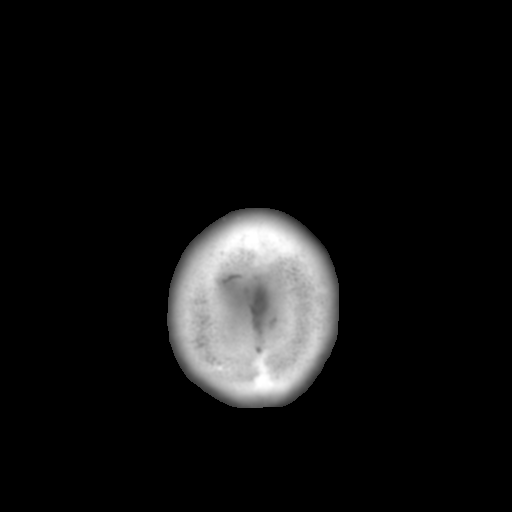

[Series 3: bone windows · axial · 0.41mm/px · z∈[+659,+779]mm · 8 of 52 slices shown]
[im 6/52  bone]
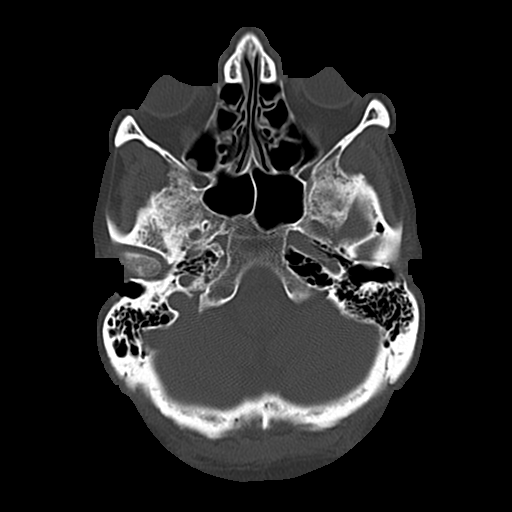
[im 12/52  bone]
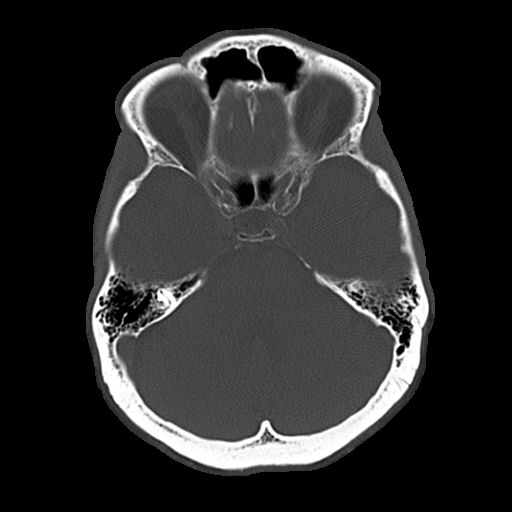
[im 18/52  bone]
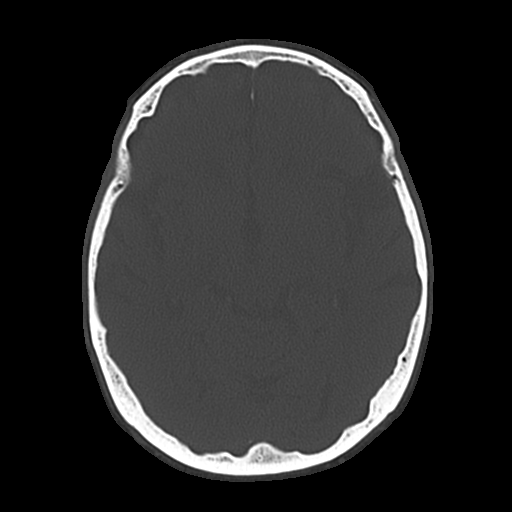
[im 23/52  bone]
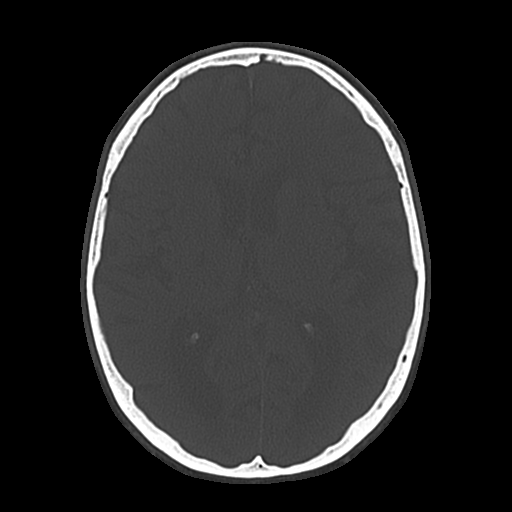
[im 29/52  bone]
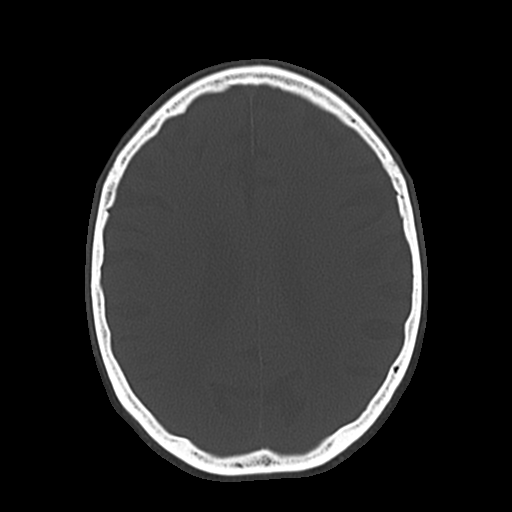
[im 35/52  bone]
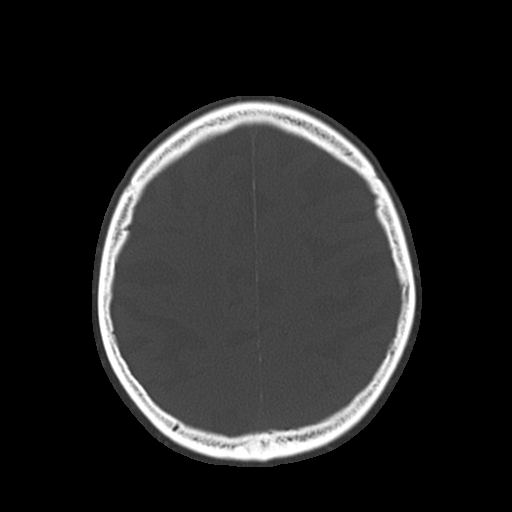
[im 40/52  bone]
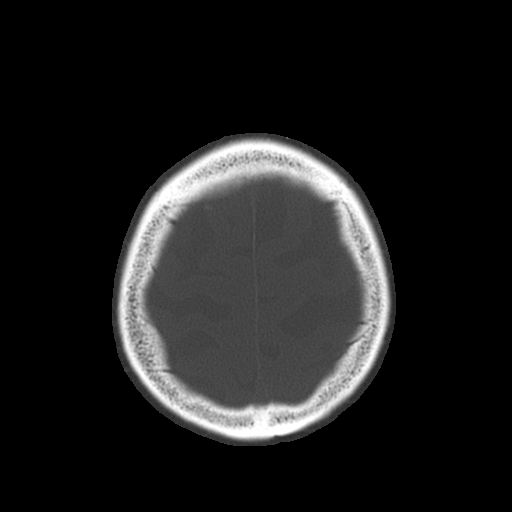
[im 46/52  bone]
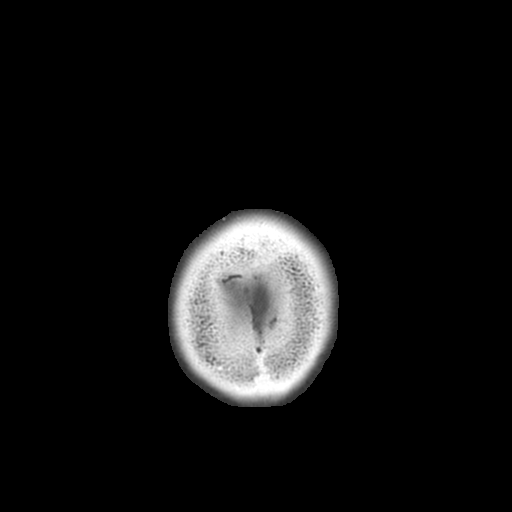

[17 of 30 positions shown; findings below may reference images not displayed]

FINDINGS: There is prominence of the sulci and ventricles consistent with
brain atrophy. There is low attenuation throughout the subcortical
and periventricular white matter consistent with chronic
microvascular disease. Old right alignment lacunar infarct is noted.
There is no evidence for acute brain infarct, hemorrhage or mass.
The paranasal sinuses are clear. The mastoid air cells are also
clear. The calvarium is intact.
IMPRESSION: 1. No acute intracranial abnormalities.
2. Chronic microvascular disease and brain atrophy.
